# Patient Record
Sex: Female | Born: 1955
Health system: Western US, Academic
[De-identification: ages and names within clinical notes are randomized; demographics above are authoritative.]

## PROBLEM LIST (undated history)

## (undated) DIAGNOSIS — R55 Syncope and collapse: Secondary | ICD-10-CM

## (undated) DIAGNOSIS — I2510 Atherosclerotic heart disease of native coronary artery without angina pectoris: Secondary | ICD-10-CM

## (undated) DIAGNOSIS — R072 Precordial pain: Secondary | ICD-10-CM

## (undated) DIAGNOSIS — E785 Hyperlipidemia, unspecified: Secondary | ICD-10-CM

## (undated) DIAGNOSIS — I10 Essential (primary) hypertension: Secondary | ICD-10-CM

## (undated) HISTORY — DX: Essential (primary) hypertension: I10

## (undated) HISTORY — DX: Syncope and collapse: R55

## (undated) HISTORY — PX: OTHER SURGICAL HISTORY: SHX169

## (undated) HISTORY — DX: Hyperlipidemia, unspecified: E78.5

## (undated) HISTORY — PX: FOOT SURGERY: SHX648

## (undated) HISTORY — PX: MANDIBLE FRACTURE SURGERY: SHX706

## (undated) HISTORY — DX: Atherosclerotic heart disease of native coronary artery without angina pectoris: I25.10

## (undated) HISTORY — PX: CARDIAC CATHETERIZATION: SHX172

## (undated) HISTORY — PX: CHOLECYSTECTOMY: SHX55

## (undated) HISTORY — PX: APPENDECTOMY: SHX54

## (undated) HISTORY — PX: ABDOMINAL HYSTERECTOMY: SHX81

## (undated) HISTORY — DX: Precordial pain: R07.2

## (undated) HISTORY — PX: WRIST FRACTURE SURGERY: SHX121

---

## 1998-06-24 ENCOUNTER — Other Ambulatory Visit: Admission: RE | Admit: 1998-06-24 | Discharge: 1998-06-24 | Payer: Self-pay | Admitting: Obstetrics & Gynecology

## 1999-03-26 ENCOUNTER — Ambulatory Visit (HOSPITAL_COMMUNITY): Admission: RE | Admit: 1999-03-26 | Discharge: 1999-03-26 | Payer: Self-pay | Admitting: Unknown Physician Specialty

## 2000-01-21 ENCOUNTER — Ambulatory Visit (HOSPITAL_COMMUNITY): Admission: RE | Admit: 2000-01-21 | Discharge: 2000-01-21 | Payer: Self-pay | Admitting: Obstetrics & Gynecology

## 2000-01-21 ENCOUNTER — Encounter: Payer: Self-pay | Admitting: Obstetrics & Gynecology

## 2000-01-25 ENCOUNTER — Emergency Department (HOSPITAL_COMMUNITY): Admission: EM | Admit: 2000-01-25 | Discharge: 2000-01-25 | Payer: Self-pay | Admitting: Emergency Medicine

## 2008-01-31 DIAGNOSIS — E559 Vitamin D deficiency, unspecified: Secondary | ICD-10-CM | POA: Insufficient documentation

## 2016-07-26 DIAGNOSIS — R079 Chest pain, unspecified: Secondary | ICD-10-CM | POA: Diagnosis not present

## 2016-07-26 DIAGNOSIS — I2510 Atherosclerotic heart disease of native coronary artery without angina pectoris: Secondary | ICD-10-CM | POA: Diagnosis not present

## 2016-07-27 DIAGNOSIS — R079 Chest pain, unspecified: Secondary | ICD-10-CM | POA: Diagnosis not present

## 2016-07-27 DIAGNOSIS — I2510 Atherosclerotic heart disease of native coronary artery without angina pectoris: Secondary | ICD-10-CM | POA: Diagnosis not present

## 2016-07-28 DIAGNOSIS — E785 Hyperlipidemia, unspecified: Secondary | ICD-10-CM

## 2016-07-28 DIAGNOSIS — I2510 Atherosclerotic heart disease of native coronary artery without angina pectoris: Secondary | ICD-10-CM

## 2016-07-28 DIAGNOSIS — R072 Precordial pain: Secondary | ICD-10-CM | POA: Insufficient documentation

## 2016-07-28 HISTORY — DX: Atherosclerotic heart disease of native coronary artery without angina pectoris: I25.10

## 2016-07-28 HISTORY — DX: Precordial pain: R07.2

## 2016-07-28 HISTORY — DX: Hyperlipidemia, unspecified: E78.5

## 2017-01-31 ENCOUNTER — Telehealth: Payer: Self-pay | Admitting: Cardiology

## 2017-01-31 MED ORDER — NITROGLYCERIN 0.4 MG SL SUBL: 0.4000 mg | SUBLINGUAL_TABLET | SUBLINGUAL | 5 refills | Status: DC | PRN

## 2017-01-31 NOTE — Telephone Encounter (Signed)
Refills sent

## 2017-01-31 NOTE — Telephone Encounter (Signed)
Please call in Nitro to the Penny [facility] on [address], she has appt with you tomorrow and is out!!

## 2017-02-01 ENCOUNTER — Ambulatory Visit: Payer: Non-veteran care | Admitting: Cardiology

## 2017-03-03 ENCOUNTER — Telehealth: Payer: Self-pay | Admitting: Cardiology

## 2017-03-03 NOTE — Telephone Encounter (Signed)
Fax Ranexa to [place] [phone] attn [facility]

## 2017-03-04 NOTE — Telephone Encounter (Signed)
Pt will need to reschedule her appointment with Dr. Agustin Cree prior to receiving additional refills.

## 2017-04-04 ENCOUNTER — Encounter: Payer: Self-pay | Admitting: Cardiology

## 2017-04-04 ENCOUNTER — Ambulatory Visit (INDEPENDENT_AMBULATORY_CARE_PROVIDER_SITE_OTHER): Payer: No Typology Code available for payment source | Admitting: Cardiology

## 2017-04-04 ENCOUNTER — Other Ambulatory Visit: Payer: Self-pay

## 2017-04-04 VITALS — BP 120/80 | HR 80 | Resp 10 | Ht 61.0 in | Wt 183.8 lb

## 2017-04-04 DIAGNOSIS — R072 Precordial pain: Secondary | ICD-10-CM | POA: Diagnosis not present

## 2017-04-04 DIAGNOSIS — I2510 Atherosclerotic heart disease of native coronary artery without angina pectoris: Secondary | ICD-10-CM

## 2017-04-04 DIAGNOSIS — E785 Hyperlipidemia, unspecified: Secondary | ICD-10-CM

## 2017-04-04 MED ORDER — RANOLAZINE ER 1000 MG PO TB12: 1000.0000 mg | ORAL_TABLET | Freq: Two times a day (BID) | ORAL | 3 refills | Status: DC

## 2017-04-04 NOTE — Patient Instructions (Signed)
Medication Instructions:  Your physician recommends that you continue on your current medications as directed. Please refer to the Current Medication list given to you today.  Based on your lab work, Dr. Agustin Cree might restart you on a statin medication. I will let you know when he reviews your labs.   Labwork: Your physician recommends that you have lab work today to check your cholesterol.  Testing/Procedures: EKG today in office.   Follow-Up: Your physician wants you to follow-up in: 4-5 months. You will receive a reminder letter in the mail two months in advance. If you don't receive a letter, please call our office to schedule the follow-up appointment.  Any Other Special Instructions Will Be Listed Below (If Applicable).  Please note that any paperwork needing to be filled out by the provider will need to be addressed at the front desk prior to seeing the provider. Please note that any paperwork FMLA, Disability or other documents regarding health condition is subject to a $25.00 charge that must be received prior to completion of paperwork in the form of a money order or check.    If you need a refill on your cardiac medications before your next appointment, please call your pharmacy.

## 2017-04-04 NOTE — Progress Notes (Signed)
Cardiology Office Note:    Date:  04/04/2017   ID:  Penny Boyer, DOB [birth year], MRN [id number]  PCP:  [place] [facility]  Cardiologist:  Jenne Campus, MD    Referring MD: No ref. provider found   Chief Complaint  Patient presents with  . Follow-up  Doing well but still have some exertional chest pain  History of Present Illness:    Penny Boyer is a 61 y.o. female have history of coronary artery disease PTCA and stenting in circumflex artery done March 2017.  Since that time she experienced rare episode of exertional chest tightness for which she takes nitroglycerin.  She is feels dramatically better while taking ranolazine thousand grams twice daily and long-acting nitroglycerin.  Stable she does not have worsening of her symptoms overall perform activities of daily living with no difficulties every day in the morning she spent 1 hour walking she usually does about 4 miles.  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  She has some difficulty tolerating statin and apparently for some reason her pravastatin were taken away.  I was able to put her and said that she was able to tolerate it.  We started talking about this again in my opinion she need to be on high intensity statin and she is willing to try it again since she lost significant weight I will recheck her cholesterol today.  I will also ask her to have EKG done today.  Past Medical History:  Diagnosis Date  . Hyperlipidemia   . Hypertension     Past Surgical History:  Procedure Laterality Date  . ABDOMINAL HYSTERECTOMY    . APPENDECTOMY    . CARDIAC CATHETERIZATION    . CHOLECYSTECTOMY    . FOOT SURGERY    . MANDIBLE FRACTURE SURGERY    . Ulnar Surgery    . WRIST FRACTURE SURGERY      Current Medications: Current Meds  Medication Sig  . aspirin (GOODSENSE ASPIRIN) 325 MG tablet Take 325 mg by mouth daily.  . clopidogrel (PLAVIX) 75 MG tablet Take  75 mg by mouth daily.  . isosorbide mononitrate (IMDUR) 60 MG 24 hr tablet Take 30 mg by mouth daily.  [name] loratadine (CLARITIN) 10 MG tablet Take 10 mg by mouth daily.  [name] losartan (COZAAR) 100 MG tablet Take 100 mg by mouth daily.  . nitroGLYCERIN (NITROSTAT) 0.4 MG SL tablet Place 1 tablet (0.4 mg total) under the tongue as needed for chest pain.  . pravastatin (PRAVACHOL) 20 MG tablet Take 20 mg by mouth daily.  . propranolol ER (INDERAL LA) 120 MG 24 hr capsule Take 120 mg by mouth daily.  . ranitidine (ZANTAC) 150 MG tablet Take 150 mg by mouth 2 (two) times daily.  . ranolazine (RANEXA) 1000 MG SR tablet Take 1,000 mg by mouth 2 (two) times daily.     Allergies:   Patient has no known allergies.   Social History   Socioeconomic History  . Marital status: Married    Spouse name: None  . Number of children: None  . Years of education: None  . Highest education level: None  Social Needs  . Financial resource strain: None  . Food insecurity - worry: None  . Food insecurity - inability: None  . Transportation needs - medical: None  . Transportation needs - non-medical: None  Occupational History  . None  Tobacco Use  . Smoking status:  Former Smoker  . Smokeless tobacco: Never Used  Substance and Sexual Activity  . Alcohol use: Yes  . Drug use: No  . Sexual activity: None  Other Topics Concern  . None  Social History Narrative  . None     Family History: The patient's family history includes Cancer in her mother; Heart disease in her brother, brother, and father. ROS:   Please see the history of present illness.    All 14 point review of systems negative except as described per history of present illness  EKGs/Labs/Other Studies Reviewed:    EKG done today shows normal sinus rhythm, normal P interval, normal QRS complex duration morphology, nonspecific ST segment changes in form of asymmetrical T inversion V1 to V3   Recent Labs: No results found for requested labs  within last 8760 hours.  Recent Lipid Panel No results found for: CHOL, TRIG, HDL, CHOLHDL, VLDL, LDLCALC, LDLDIRECT  Physical Exam:    VS:  BP 120/80   Pulse 80   Resp 10   Ht 5\' 1"  (1.549 m)   Wt 183 lb 12.8 oz (83.4 kg)   BMI 34.73 kg/m     Wt Readings from Last 3 Encounters:  04/04/17 183 lb 12.8 oz (83.4 kg)     GEN:  Well nourished, well developed in no acute distress HEENT: Normal NECK: No JVD; No carotid bruits LYMPHATICS: No lymphadenopathy CARDIAC: RRR, no murmurs, no rubs, no gallops RESPIRATORY:  Clear to auscultation without rales, wheezing or rhonchi  ABDOMEN: Soft, non-tender, non-distended MUSCULOSKELETAL:  No edema; No deformity  SKIN: Warm and dry LOWER EXTREMITIES: no swelling NEUROLOGIC:  Alert and oriented x 3 PSYCHIATRIC:  Normal affect   ASSESSMENT:    1. Dyslipidemia (high LDL; low HDL)   2. Coronary artery disease involving native coronary artery of native heart without angina pectoris   3. Precordial pain    PLAN:    In order of problems listed above:  1. Coronary artery disease: Stable angina pectoris: Her last stress test from March of this year reviewed it was done in the hospital showed no evidence of ischemia.  I will continue present management which includes beta-blocker, ranolazine, long-acting nitrates and nitroglycerin as needed. 2. Dyslipidemia: We will recheck her fasting lipid profile today 3. Precordial chest pain: Rare episodes plan as outlined above.   Medication Adjustments/Labs and Tests Ordered: Current medicines are reviewed at length with the patient today.  Concerns regarding medicines are outlined above.  No orders of the defined types were placed in this encounter.  Medication changes: No orders of the defined types were placed in this encounter.   Signed, Park Liter, MD, [facility] 04/04/2017 11:12 AM    [place]

## 2017-04-04 NOTE — Progress Notes (Signed)
RX reprinted to fax to [place].

## 2017-04-05 LAB — LIPID PANEL
CHOL/HDL RATIO: 4.2 ratio (ref 0.0–4.4)
Cholesterol, Total: 250 mg/dL — ABNORMAL HIGH (ref 100–199)
HDL: 59 mg/dL (ref >39)
LDL CALC: 148 mg/dL — AB (ref 0–99)
TRIGLYCERIDES: 215 mg/dL — AB (ref 0–149)
VLDL Cholesterol Cal: 43 mg/dL — ABNORMAL HIGH (ref 5–40)

## 2018-06-19 ENCOUNTER — Telehealth: Payer: Self-pay | Admitting: Cardiology

## 2018-06-21 NOTE — Telephone Encounter (Signed)
done

## 2018-08-07 ENCOUNTER — Telehealth: Payer: Self-pay | Admitting: Cardiology

## 2018-08-07 MED ORDER — RANOLAZINE ER 1000 MG PO TB12: 1000.0000 mg | ORAL_TABLET | Freq: Two times a day (BID) | ORAL | 0 refills | Status: DC

## 2018-08-07 NOTE — Telephone Encounter (Signed)
Ranexa 1000 mg twice daily refilled.

## 2018-08-07 NOTE — Addendum Note (Signed)
Addended by: Ashok Norris on: 08/07/2018 04:21 PM   Modules accepted: Orders

## 2018-08-07 NOTE — Telephone Encounter (Signed)
°  Patient is out of medication!!!   1. Which medications need to be refilled? (please list name of each medication and dose if known) ranolazine 1000mg  twice daily  2. Which pharmacy/location (including street and city if local pharmacy) is medication to be sent to?[facility] in [place]  3. Do they need a 30 day or 90 day supply? [place]

## 2018-09-12 ENCOUNTER — Telehealth: Payer: Self-pay | Admitting: *Deleted

## 2018-09-12 MED ORDER — NITROGLYCERIN 0.4 MG SL SUBL: 0.4000 mg | SUBLINGUAL_TABLET | SUBLINGUAL | 5 refills | Status: DC | PRN

## 2018-09-12 NOTE — Telephone Encounter (Signed)
Rx refill sent to pharmacy.   *STAT* If patient is at the pharmacy, call can be transferred to refill team.   1. Which medications need to be refilled? (please list name of each medication and dose if known) Nitroglycerine  2. Which pharmacy/location (including street and city if local pharmacy) is medication to be sent to?[place]  3. Do they need a 30 day or 90 day supply?

## 2018-09-15 ENCOUNTER — Telehealth: Payer: Self-pay | Admitting: *Deleted

## 2018-09-15 NOTE — Telephone Encounter (Signed)
Pt set up for virtual visit on 5/18 but having some chest pain and shortness of breath. Has been taking nitro for it. I let her know someone may be calling her about this.     Cardiac Questionnaire:    Since your last visit or hospitalization:    1. Have you been having new or worsening chest pain? yes   2. Have you been having new or worsening shortness of breath? yes 3. Have you been having new or worsening leg swelling, wt gain, or increase in abdominal girth (pants fitting more tightly)? no   4. Have you had any passing out spells? no    *A YES to any of these questions would result in the appointment being kept. *If all the answers to these questions are NO, we should indicate that given the current situation regarding the worldwide coronarvirus pandemic, at the recommendation of the CDC, we are looking to limit gatherings in our waiting area, and thus will reschedule their appointment beyond four weeks from today.   _____________   [id number] Pre-Screening Questions:  . Do you currently have a fever? no (yes = cancel and refer to pcp for e-visit) . Have you recently travelled on a cruise, internationally, or to [place], [place], [place], [place], [place], or [place], [place] [employer]) ? no (yes = cancel, stay home, monitor symptoms, and contact pcp or initiate e-visit if symptoms develop) . Have you been in contact with someone that is currently pending confirmation of Covid19 testing or has been confirmed to have the [place] virus?  no (yes = cancel, stay home, away from tested individual, monitor symptoms, and contact pcp or initiate e-visit if symptoms develop) . Are you currently experiencing fatigue or cough? no (yes = pt should be prepared to have a mask placed at the time of their visit).         YOUR CARDIOLOGY TEAM HAS ARRANGED FOR AN E-VISIT FOR YOUR APPOINTMENT - PLEASE REVIEW IMPORTANT INFORMATION BELOW SEVERAL DAYS PRIOR TO YOUR APPOINTMENT  Due to the recent COVID-19 pandemic, we are  transitioning in-person office visits to tele-medicine visits in an effort to decrease unnecessary exposure to our patients, their families, and staff. These visits are billed to your insurance just like a normal visit is. We also encourage you to sign up for MyChart if you have not already done so. You will need a smartphone if possible. For patients that do not have this, we can still complete the visit using a regular telephone but do prefer a smartphone to enable video when possible. You may have a family member that lives with you that can help. If possible, we also ask that you have a blood pressure cuff and scale at home to measure your blood pressure, heart rate and weight prior to your scheduled appointment. Patients with clinical needs that need an in-person evaluation and testing will still be able to come to the office if absolutely necessary. If you have any questions, feel free to call our office.     YOUR PROVIDER WILL BE USING THE FOLLOWING PLATFORM TO COMPLETE YOUR VISIT: Staff: Please delete this text and fill in MyChart/[email or web address]  . IF USING MYCHART - How to Download the MyChart App to Your SmartPhone   - If Apple, go to [employer] and type in MyChart in the search bar and download the app. If Android, ask patient to go to [employer] and type in [place] in the search bar and download the app. The  app is free but as with any other app downloads, your phone may require you to verify saved payment information or Apple/Android password.  - You will need to then log into the app with your MyChart username and password, and select [facility] as your healthcare provider to link the account.  - When it is time for your visit, go to the MyChart app, find appointments, and click Begin Video Visit. Be sure to Select Allow for your device to access the Microphone and Camera for your visit. You will then be connected, and your provider will be with you shortly.  **If you have any  issues connecting or need assistance, please contact MyChart service desk [phone])**  **If using a computer, in order to ensure the best quality for your visit, you will need to use either of the following Internet Browsers: Insurance underwriter or [employer]**  . IF USING DOXIMITY or [email or web address] - The staff will give you instructions on receiving your link to join the meeting the day of your visit.      2-3 DAYS BEFORE YOUR APPOINTMENT  You will receive a telephone call from one of our [facility] team members - your caller ID may say "Unknown caller." If this is a video visit, we will walk you through how to get the video launched on your phone. We will remind you check your blood pressure, heart rate and weight prior to your scheduled appointment. If you have an Apple Watch or Kardia, please upload any pertinent ECG strips the day before or morning of your appointment to [facility]. Our staff will also make sure you have reviewed the consent and agree to move forward with your scheduled tele-health visit.     THE DAY OF YOUR APPOINTMENT  Approximately 15 minutes prior to your scheduled appointment, you will receive a telephone call from one of [facility] team - your caller ID may say "Unknown caller."  Our staff will confirm medications, vital signs for the day and any symptoms you may be experiencing. Please have this information available prior to the time of visit start. It may also be helpful for you to have a pad of paper and pen handy for any instructions given during your visit. They will also walk you through joining the smartphone meeting if this is a video visit.    CONSENT FOR TELE-HEALTH VISIT - PLEASE REVIEW  I hereby voluntarily request, consent and authorize [facility] and its employed or contracted physicians, physician assistants, nurse practitioners or other licensed health care professionals (the Practitioner), to provide me with telemedicine health care  services (the "Services") as deemed necessary by the treating Practitioner. I acknowledge and consent to receive the Services by the Practitioner via telemedicine. I understand that the telemedicine visit will involve communicating with the Practitioner through live audiovisual communication technology and the disclosure of certain medical information by electronic transmission. I acknowledge that I have been given the opportunity to request an in-person assessment or other available alternative prior to the telemedicine visit and am voluntarily participating in the telemedicine visit.  I understand that I have the right to withhold or withdraw my consent to the use of telemedicine in the course of my care at any time, without affecting my right to future care or treatment, and that the Practitioner or I may terminate the telemedicine visit at any time. I understand that I have the right to inspect all information obtained and/or recorded in the course of the telemedicine visit and may  receive copies of available information for a reasonable fee.  I understand that some of the potential risks of receiving the Services via telemedicine include:  [name] Delay or interruption in medical evaluation due to technological equipment failure or disruption; . Information transmitted may not be sufficient (e.g. poor resolution of images) to allow for appropriate medical decision making by the Practitioner; and/or  . In rare instances, security protocols could fail, causing a breach of personal health information.  Furthermore, I acknowledge that it is my responsibility to provide information about my medical history, conditions and care that is complete and accurate to the best of my ability. I acknowledge that Practitioner's advice, recommendations, and/or decision may be based on factors not within their control, such as incomplete or inaccurate data provided by me or distortions of diagnostic images or specimens that may  result from electronic transmissions. I understand that the practice of medicine is not an exact science and that Practitioner makes no warranties or guarantees regarding treatment outcomes. I acknowledge that I will receive a copy of this consent concurrently upon execution via email to the email address I last provided but may also request a printed copy by calling the office of [facility].    I understand that my insurance will be billed for this visit.   I have read or had this consent read to me. . I understand the contents of this consent, which adequately explains the benefits and risks of the Services being provided via telemedicine.  . I have been provided ample opportunity to ask questions regarding this consent and the Services and have had my questions answered to my satisfaction. . I give my informed consent for the services to be provided through the use of telemedicine in my medical care  By participating in this telemedicine visit I agree to the above. Pt gives consent

## 2018-09-15 NOTE — Telephone Encounter (Signed)
Patient complains of having chest pain for over 1 week and shortness of breath patient advised to go to the emergency department she verbally understands.

## 2018-09-28 ENCOUNTER — Encounter: Payer: Self-pay | Admitting: *Deleted

## 2018-09-28 DIAGNOSIS — E785 Hyperlipidemia, unspecified: Secondary | ICD-10-CM | POA: Insufficient documentation

## 2018-09-28 DIAGNOSIS — I10 Essential (primary) hypertension: Secondary | ICD-10-CM | POA: Insufficient documentation

## 2018-10-02 ENCOUNTER — Other Ambulatory Visit: Payer: Self-pay

## 2018-10-02 ENCOUNTER — Encounter: Payer: Self-pay | Admitting: Cardiology

## 2018-10-02 ENCOUNTER — Telehealth (INDEPENDENT_AMBULATORY_CARE_PROVIDER_SITE_OTHER): Payer: No Typology Code available for payment source | Admitting: Cardiology

## 2018-10-02 ENCOUNTER — Encounter: Payer: Self-pay | Admitting: Emergency Medicine

## 2018-10-02 VITALS — BP 163/108 | HR 78 | Wt 182.0 lb

## 2018-10-02 DIAGNOSIS — E785 Hyperlipidemia, unspecified: Secondary | ICD-10-CM

## 2018-10-02 DIAGNOSIS — I2510 Atherosclerotic heart disease of native coronary artery without angina pectoris: Secondary | ICD-10-CM

## 2018-10-02 DIAGNOSIS — I10 Essential (primary) hypertension: Secondary | ICD-10-CM

## 2018-10-02 DIAGNOSIS — E782 Mixed hyperlipidemia: Secondary | ICD-10-CM

## 2018-10-02 NOTE — Progress Notes (Signed)
Virtual Visit via Telephone Note   This visit type was conducted due to national recommendations for restrictions regarding the COVID-19 Pandemic (e.g. social distancing) in an effort to limit this patient's exposure and mitigate transmission in our community.  Due to her co-morbid illnesses, this patient is at least at moderate risk for complications without adequate follow up.  This format is felt to be most appropriate for this patient at this time.  The patient did not have access to video technology/had technical difficulties with video requiring transitioning to audio format only (telephone).  All issues noted in this document were discussed and addressed.  No physical exam could be performed with this format.  Please refer to the patient's chart for her  consent to telehealth for [facility].  Evaluation Performed:  Follow-up visit  This visit type was conducted due to national recommendations for restrictions regarding the COVID-19 Pandemic (e.g. social distancing).  This format is felt to be most appropriate for this patient at this time.  All issues noted in this document were discussed and addressed.  No physical exam was performed (except for noted visual exam findings with Video Visits).  Please refer to the patient's chart (MyChart message for video visits and phone note for telephone visits) for the patient's consent to telehealth for [facility].  Date:  10/02/2018  ID: Penny Boyer, DOB [birth year], MRN [id number]   Patient Location: [place] [address]   Provider location:   [place] Office  PCP:  [place]  Cardiologist:  Jenne Campus, MD     Chief Complaint: Doing well but 2 weeks ago had some chest pain  History of Present Illness:    Penny Boyer is a 63 y.o. female  who presents via audio/video conferencing for a telehealth visit today.  With coronary artery disease, status post PTCA and stenting  involving the circumflex artery done in March 2017.  Since that time she has been doing very well.  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  Those pain could last for few minutes she actually was thinking about retrying nitroglycerin but did not do it.  Since that time she is completely asymptomatic again she is busy taking care of her animals have no difficulty.   The patient does not have symptoms concerning for COVID-19 infection (fever, chills, cough, or new SHORTNESS OF BREATH).    Prior CV studies:   The following studies were reviewed today:       Past Medical History:  Diagnosis Date  . Coronary artery disease involving native coronary artery has post PTCA and stenting of the circumflex artery in March 2017 of native heart 07/28/2016   Overview:  PTCA and stent of LCX artery in March 2017 in [place]  . Dyslipidemia (high LDL; low HDL) 07/28/2016  . Hyperlipidemia   . Hypertension   . Precordial pain 07/28/2016       Current Meds  Medication Sig  . aspirin (GOODSENSE ASPIRIN) 325 MG tablet Take 325 mg by mouth daily.  . isosorbide mononitrate (IMDUR) 30 MG 24 hr tablet Take 30 mg by mouth daily.   [name] losartan (COZAAR) 100 MG tablet Take 100 mg by mouth daily.  . nitroGLYCERIN (NITROSTAT) 0.4 MG SL tablet Place 1 tablet (0.4  mg total) under the tongue as needed for chest pain.  [name] propranolol ER (INDERAL LA) 120 MG 24 hr capsule Take 120 mg by mouth daily.  . ranolazine (RANEXA) 1000 MG SR tablet Take 1 tablet (1,000 mg total) by mouth 2 (two) times daily.      Family History: The patient's family history includes Cancer in her mother; Heart disease in her brother, brother, and father.   ROS:   Please see the history of present illness.     All other systems reviewed and are negative.    Labs/Other Tests and Data Reviewed:     Recent Labs: No results found for requested labs within last 8760 hours.  Recent Lipid Panel    Component Value Date/Time   CHOL 250 (H) 04/04/2017 1138   TRIG 215 (H) 04/04/2017 1138   HDL 59 04/04/2017 1138   CHOLHDL 4.2 04/04/2017 1138   LDLCALC 148 (H) 04/04/2017 1138      Exam:    Vital Signs:  BP (!) 163/108   Pulse 78   Wt 182 lb (82.6 kg)   BMI 34.39 kg/m     Wt Readings from Last 3 Encounters:  10/02/18 182 lb (82.6 kg)  04/04/17 183 lb 12.8 oz (83.4 kg)     Well nourished, well developed in no acute distress. Alert awake in exam 3 quite cheerful happy to be able to talk to me over the phone.  Diagnosis for this visit:   1. Coronary artery disease involving native coronary artery of native heart without angina pectoris   2. Dyslipidemia (high LDL; low HDL)   3. Mixed hyperlipidemia   4. Essential hypertension      ASSESSMENT & PLAN:    1.  Coronary artery disease.  She described to have some chest pain about 2 weeks ago.  I will schedule her to stress test to make sure she does not have any significant inducible ischemia in the meantime we will continue antianginal therapy which include ranolazine as well as Imdur. 2.  Dyslipidemia ongoing issues she is supposed to be referred to lipid clinic for some reason it did not happen.  She got difficulty tolerating statin she was on pravastatin and had difficulty tolerating this Dr. CVA switch her to something different that she does not remember the name but also she could not tolerate it.  I think we reached the point that more aggressive management needs to be implemented.  Therefore, she will be referred to lipid clinic for consideration of for PCSK9 agent.  I will also ask you to have fasting lipid profile when she comes here next time which will be for stress test.  I anticipate stress test to be done in few weeks. 3.  Essential hypertension blood pressure appears to be  controlled when she is at home.  We will continue monitoring.  COVID-19 Education: The signs and symptoms of COVID-19 were discussed with the patient and how to seek care for testing (follow up with PCP or arrange E-visit).  The importance of social distancing was discussed today.  Patient Risk:   After full review of this patients clinical status, I feel that they are at least moderate risk at this time.  Time:   Today, I have spent 21 minutes with the patient with telehealth technology discussing pt health issues.  I spent 5 minutes reviewing her chart before the visit.  Visit was finished at 8:55 AM.    Medication Adjustments/Labs and Tests Ordered: Current medicines are  reviewed at length with the patient today.  Concerns regarding medicines are outlined above.  Orders Placed This Encounter  Procedures  . Lipid panel  . AMB Referral to Advanced Lipid Disorders Clinic  . MYOCARDIAL PERFUSION IMAGING   Medication changes: No orders of the defined types were placed in this encounter.    Disposition: Follow-up in 1 month  Signed, Park Liter, MD, [facility] 10/02/2018 9:16 AM    [place]

## 2018-10-02 NOTE — Patient Instructions (Signed)
Medication Instructions:  Your physician recommends that you continue on your current medications as directed. Please refer to the Current Medication list given to you today.  If you need a refill on your cardiac medications before your next appointment, please call your pharmacy.   Lab work: Your physician recommends that you return for lab work in 1 week: Fasting lipids  If you have labs (blood work) drawn today and your tests are completely normal, you will receive your results only by: [name] MyChart Message (if you have MyChart) OR . A paper copy in the mail If you have any lab test that is abnormal or we need to change your treatment, we will call you to review the results.  Testing/Procedures: Your physician has requested that you have a lexiscan myoview. For further information please visit [email or web address]. Please follow instruction sheet, as given.    Follow-Up: At [facility], you and your health needs are our priority.  As part of our continuing mission to provide you with exceptional heart care, we have created designated Provider Care Teams.  These Care Teams include your primary Cardiologist (physician) and Advanced Practice Providers (APPs -  Physician Assistants and Nurse Practitioners) who all work together to provide you with the care you need, when you need it. You will need a follow up appointment in 1 months.  Please call our office 2 months in advance to schedule this appointment.  You may see No primary care provider on file. or another member of our [employer] Provider Team in [place]: Shirlee More, MD . Jyl Heinz, MD  Any Other Special Instructions Will Be Listed Below (If Applicable).  Dr. Agustin Cree has referred you to the lipid clinic, their office should call you within 1 week. If not please call our office.   Cardiac Nuclear Scan A cardiac nuclear scan is a test that measures blood flow to the heart when a person is resting and when he or she is  exercising. The test looks for problems such as:  Not enough blood reaching a portion of the heart.  The heart muscle not working normally. You may need this test if:  You have heart disease.  You have had abnormal lab results.  You have had heart surgery or a balloon procedure to open up blocked arteries (angioplasty).  You have chest pain.  You have shortness of breath. In this test, a radioactive dye (tracer) is injected into your bloodstream. After the tracer has traveled to your heart, an imaging device is used to measure how much of the tracer is absorbed by or distributed to various areas of your heart. This procedure is usually done at a hospital and takes 2-4 hours. Tell a health care provider about:  Any allergies you have.  All medicines you are taking, including vitamins, herbs, eye drops, creams, and over-the-counter medicines.  Any problems you or family members have had with anesthetic medicines.  Any blood disorders you have.  Any surgeries you have had.  Any medical conditions you have.  Whether you are pregnant or may be pregnant. What are the risks? Generally, this is a safe procedure. However, problems may occur, including:  Serious chest pain and heart attack. This is only a risk if the stress portion of the test is done.  Rapid heartbeat.  Sensation of warmth in your chest. This usually passes quickly.  Allergic reaction to the tracer. What happens before the procedure?  Ask your health care provider about changing or stopping your regular  medicines. This is especially important if you are taking diabetes medicines or blood thinners.  Follow instructions from your health care provider about eating or drinking restrictions.  Remove your jewelry on the day of the procedure. What happens during the procedure?  An IV will be inserted into one of your veins.  Your health care provider will inject a small amount of radioactive tracer through the  IV.  You will wait for 20-40 minutes while the tracer travels through your bloodstream.  Your heart activity will be monitored with an electrocardiogram (ECG).  You will lie down on an exam table.  Images of your heart will be taken for about 15-20 minutes.  You may also have a stress test. For this test, one of the following may be done: ? You will exercise on a treadmill or stationary bike. While you exercise, your heart's activity will be monitored with an ECG, and your blood pressure will be checked. ? You will be given medicines that will increase blood flow to parts of your heart. This is done if you are unable to exercise.  When blood flow to your heart has peaked, a tracer will again be injected through the IV.  After 20-40 minutes, you will get back on the exam table and have more images taken of your heart.  Depending on the type of tracer used, scans may need to be repeated 3-4 hours later.  Your IV line will be removed when the procedure is over. The procedure may vary among health care providers and hospitals. What happens after the procedure?  Unless your health care provider tells you otherwise, you may return to your normal schedule, including diet, activities, and medicines.  Unless your health care provider tells you otherwise, you may increase your fluid intake. This will help to flush the contrast dye from your body. Drink enough fluid to keep your urine pale yellow.  Ask your health care provider, or the department that is doing the test: ? When will my results be ready? ? How will I get my results? Summary  A cardiac nuclear scan measures the blood flow to the heart when a person is resting and when he or she is exercising.  Tell your health care provider if you are pregnant.  Before the procedure, ask your health care provider about changing or stopping your regular medicines. This is especially important if you are taking diabetes medicines or blood  thinners.  After the procedure, unless your health care provider tells you otherwise, increase your fluid intake. This will help flush the contrast dye from your body.  After the procedure, unless your health care provider tells you otherwise, you may return to your normal schedule, including diet, activities, and medicines. This information is not intended to replace advice given to you by your health care provider. Make sure you discuss any questions you have with your health care provider. Document Released: 05/28/2004 Document Revised: 10/17/2017 Document Reviewed: 10/17/2017 Elsevier Interactive Patient Education  2019 [employer].

## 2018-10-04 ENCOUNTER — Telehealth: Payer: Self-pay | Admitting: Internal Medicine

## 2018-10-04 NOTE — Telephone Encounter (Signed)
Lmtcb.  New patient.  Needs appointment in [facility].  Can be a virtual visit. Please schedule. Thanks

## 2018-11-10 ENCOUNTER — Telehealth: Payer: Self-pay | Admitting: Internal Medicine

## 2018-11-10 NOTE — Telephone Encounter (Signed)
called to pre reg VM box is full, no my chart & no email

## 2018-11-14 ENCOUNTER — Telehealth: Payer: No Typology Code available for payment source | Admitting: Cardiology

## 2018-11-14 NOTE — Telephone Encounter (Signed)
° ° °  Virtual Visit Pre-Appointment Phone Call 1. 3. "In the setting of the current Covid19 crisis, you are scheduled for a (phone or video) visit with your provider on (date) at (time).  Just as we do with many in-office visits, in order for you to participate in this visit, we must obtain consent.  If you'd like, I can send this to your mychart (if signed up) or email for you to review.  Otherwise, I can obtain your verbal consent now.  All virtual visits are billed to your insurance company just like a normal visit would be.  By agreeing to a virtual visit, we'd like you to understand that the technology does not allow for your provider to perform an examination, and thus may limit your provider's ability to fully assess your condition. If your provider identifies any concerns that need to be evaluated in person, we will make arrangements to do so.  Finally, though the technology is pretty good, we cannot assure that it will always work on either your or our end, and in the setting of a video visit, we may have to convert it to a phone-only visit.  In either situation, we cannot ensure that we have a secure connection.  Are you willing to proceed?" STAFF: Did the patient verbally acknowledge consent to telehealth visit? Document YES/NO here: Yes, pt gave her cconsent  2.  I called pt to confirm her visit with Dr Debara Pickett on 11-15-18.

## 2018-11-15 ENCOUNTER — Telehealth (INDEPENDENT_AMBULATORY_CARE_PROVIDER_SITE_OTHER): Payer: No Typology Code available for payment source | Admitting: Internal Medicine

## 2018-11-15 ENCOUNTER — Encounter: Payer: Self-pay | Admitting: Internal Medicine

## 2018-11-15 ENCOUNTER — Telehealth: Payer: Self-pay | Admitting: Internal Medicine

## 2018-11-15 VITALS — BP 149/91 | HR 78 | Ht 61.0 in | Wt 175.0 lb

## 2018-11-15 DIAGNOSIS — I2510 Atherosclerotic heart disease of native coronary artery without angina pectoris: Secondary | ICD-10-CM

## 2018-11-15 DIAGNOSIS — I10 Essential (primary) hypertension: Secondary | ICD-10-CM | POA: Diagnosis not present

## 2018-11-15 DIAGNOSIS — E785 Hyperlipidemia, unspecified: Secondary | ICD-10-CM | POA: Diagnosis not present

## 2018-11-15 DIAGNOSIS — Z789 Other specified health status: Secondary | ICD-10-CM

## 2018-11-15 NOTE — Patient Instructions (Signed)
Medication Instructions:  NO CHANGES  Dr. Debara Pickett will advise on medication changes after lab test If you need a refill on your cardiac medications before your next appointment, please call your pharmacy.   Lab work: FASTING lab work to check cholesterol  If you have labs (blood work) drawn today and your tests are completely normal, you will receive your results only by: [name] MyChart Message (if you have MyChart) OR . A paper copy in the mail If you have any lab test that is abnormal or we need to change your treatment, we will call you to review the results.  Testing/Procedures: NONE  Follow-Up: Dr. Debara Pickett recommends that you schedule a follow up visit with him the in the [place] in 3-4 months.

## 2018-11-15 NOTE — Progress Notes (Signed)
Virtual Visit via Telephone Note   This visit type was conducted due to national recommendations for restrictions regarding the COVID-19 Pandemic (e.g. social distancing) in an effort to limit this patient's exposure and mitigate transmission in our community.  Due to her co-morbid illnesses, this patient is at least at moderate risk for complications without adequate follow up.  This format is felt to be most appropriate for this patient at this time.  The patient did not have access to video technology/had technical difficulties with video requiring transitioning to audio format only (telephone).  All issues noted in this document were discussed and addressed.  No physical exam could be performed with this format.  Please refer to the patient's chart for her  consent to telehealth for [facility].   Evaluation Performed:  Telephone visit  Date:  11/15/2018   ID:  Penny Boyer, DOB March 31, 1956, MRN [id number]  Patient Location:  [place] [address]  Provider location:   [address], [address] [place], [place] [address]  PCP:  [place]  Cardiologist:  No primary care provider on file. Electrophysiologist:  None   Chief Complaint:  Dyslipidemia, statin intolerance  History of Present Illness:    Penny Boyer is a 63 y.o. female who presents via audio/video conferencing for a telehealth visit today.  This is a pleasant 63 year old female who is a former veteran and gets some care through the [place] as well as is a patient of Dr. Agustin Cree in [place].  She has a history of coronary artery disease and had PCI to the circumflex artery in 2017.  Recently she saw Dr. Agustin Cree and had been having some chest pain.  He had ordered a stress test however due to the coronavirus pandemic this was postponed.  She says those symptoms have resolved however recently had an incident with a horse and had some broken ribs.  She is recovering from that.  She is  rescheduled her stress test for December 06, 2018.  At that time she plans to have a repeat lipid profile.  Statin intolerance, both pravastatin and rosuvastatin were not tolerated due to myalgias.  A year ago showed total cholesterol 250, triglycerides 215, HDL 59 and LDL of 148.  Her goal LDL is less than 70.  She reports a fairly healthy diet low in saturated fat and cholesterol and she is physically active.  The patient does not have symptoms concerning for COVID-19 infection (fever, chills, cough, or new SHORTNESS OF BREATH).    Prior CV studies:   The following studies were reviewed today:  Chart review Lab work  PMHx:  Past Medical History:  Diagnosis Date  . Coronary artery disease involving native coronary artery has post PTCA and stenting of the circumflex artery in March 2017 of native heart 07/28/2016   Overview:  PTCA and stent of LCX artery in March 2017 in [place]  . Dyslipidemia (high LDL; low HDL) 07/28/2016  . Hyperlipidemia   . Hypertension   . Precordial pain 07/28/2016    FAMHx:  Family History  Problem Relation Age of Onset  . Cancer Mother   . Heart disease Father   . Heart disease Brother   . Heart disease Brother     SOCHx:   reports that she has quit smoking. She has never used smokeless tobacco. She reports current alcohol use. She reports that she does not use drugs.  ALLERGIES:  Allergies  Allergen Reactions  . Atorvastatin  Other (See Comments)    JOINT PAIN  . Hydrochlorothiazide W-Triamterene Other (See Comments)    UNKNOWN    MEDS:  Current Meds  Medication Sig  . aspirin (GOODSENSE ASPIRIN) 325 MG tablet Take 325 mg by mouth daily.  . isosorbide mononitrate (IMDUR) 30 MG 24 hr tablet Take 30 mg by mouth daily.   [name] losartan (COZAAR) 100 MG tablet Take 100 mg by mouth daily.  . nitroGLYCERIN (NITROSTAT) 0.4 MG SL tablet Place 1 tablet (0.4 mg total) under the tongue as needed for chest pain.  [name] propranolol ER (INDERAL LA) 120 MG 24 hr  capsule Take 120 mg by mouth daily.  . ranolazine (RANEXA) 1000 MG SR tablet Take 1 tablet (1,000 mg total) by mouth 2 (two) times daily.     ROS: month(s)  Labs/Other Tests and Data Reviewed:    Recent Labs: No results found for requested labs within last 8760 hours.   Recent Lipid Panel Lab Results  Component Value Date/Time   CHOL 250 (H) 04/04/2017 11:38 AM   TRIG 215 (H) 04/04/2017 11:38 AM   HDL 59 04/04/2017 11:38 AM   CHOLHDL 4.2 04/04/2017 11:38 AM   LDLCALC 148 (H) 04/04/2017 11:38 AM    Wt Readings from Last 3 Encounters:  11/15/18 175 lb (79.4 kg)  10/02/18 182 lb (82.6 kg)  04/04/17 183 lb 12.8 oz (83.4 kg)     Exam:    Vital Signs:  BP (!) 149/91   Pulse 78   Ht 5\' 1"  (1.549 m)   Wt 175 lb (79.4 kg)   BMI 33.07 kg/m    Exam not performed due to telephone visit  ASSESSMENT & PLAN:    1. Mixed dyslipidemia 2. Coronary artery disease status post PCI to the circumflex artery 2017 3. Statin intolerance  Ms. Toure has been intolerant to statins in the past with persistently elevated cholesterol including LDL and high triglycerides.  She is at increased risk for residual cardiovascular events.  She recently had some anginal pain and is awaiting stress testing.  She will have repeat fasting lipid testing at that time.  Based on those findings, we can likely proceed forward with PCSK9 inhibitor therapy.  I think she is a good candidate for Repatha 140 mg every 2 weeks.  We discussed that therapy today she seems willing to try it.  We would then repeat labs in about 3 to 4 months after starting it.  Thanks as always for the kind referral.  COVID-19 Education: The signs and symptoms of COVID-19 were discussed with the patient and how to seek care for testing (follow up with PCP or arrange E-visit).  The importance of social distancing was discussed today.  Patient Risk:   After full review of this patients clinical status, I feel that they are at least  moderate risk at this time.  Time:   Today, I have spent 25 minutes with the patient with telehealth technology discussing chest pain, dyslipidemia, coronary artery disease, statin intolerance.     Medication Adjustments/Labs and Tests Ordered: Current medicines are reviewed at length with the patient today.  Concerns regarding medicines are outlined above.   Tests Ordered: Orders Placed This Encounter  Procedures  . Lipid panel    Medication Changes: No orders of the defined types were placed in this encounter.   Disposition:  in 3 month(s)  Pixie Casino, MD, [facility], [place] Director of the Advanced Lipid Disorders &  Cardiovascular Risk Reduction Clinic Diplomate of the American Board of Clinical Lipidology Attending Cardiologist  Direct Dial: [phone]  Fax: [phone]  Website:  [email or web address]  Pixie Casino, MD  11/15/2018 11:04 AM

## 2018-11-15 NOTE — Telephone Encounter (Signed)
Patient called to review e-visit instructions. Patient aware that the following changes have been made: none Patient aware that they will need the following labs: fasting labs - patient to complete 7/22 same day as stress test Patient aware that they will need the following test(s): none  Recall for 3-4 month (lipid) entered.   No further assistance needed at this time.

## 2018-11-30 ENCOUNTER — Telehealth (HOSPITAL_COMMUNITY): Payer: Self-pay | Admitting: *Deleted

## 2018-11-30 NOTE — Telephone Encounter (Signed)
Patient given detailed instructions per Myocardial Perfusion Study Information Sheet for the test on 12/06/18 Patient notified to arrive 15 minutes early and that it is imperative to arrive on time for appointment to keep from having the test rescheduled.  If you need to cancel or reschedule your appointment, please call the office within 24 hours of your appointment. . Patient verbalized understanding. Kirstie Peri

## 2018-11-30 NOTE — Telephone Encounter (Signed)
Patient given detailed instructions per Myocardial Perfusion Study Information Sheet for the test on 12/06/18. Patient notified to arrive 15 minutes early and that it is imperative to arrive on time for appointment to keep from having the test rescheduled.  If you need to cancel or reschedule your appointment, please call the office within 24 hours of your appointment. . Patient verbalized understanding Kirstie Peri

## 2018-12-01 ENCOUNTER — Telehealth: Payer: Self-pay | Admitting: Cardiology

## 2018-12-01 MED ORDER — RANOLAZINE ER 1000 MG PO TB12: 1000.0000 mg | ORAL_TABLET | Freq: Two times a day (BID) | ORAL | 0 refills | Status: DC

## 2018-12-01 NOTE — Telephone Encounter (Signed)
Ranolazine refill faxed to [facility] at (fax) [phone]. Transmission confirmation received.

## 2018-12-01 NOTE — Telephone Encounter (Signed)
Call [place] to [facility]

## 2018-12-01 NOTE — Telephone Encounter (Signed)
Phoned pt, informed refill has been faxed to [facility]

## 2018-12-06 ENCOUNTER — Other Ambulatory Visit: Payer: Self-pay

## 2018-12-06 ENCOUNTER — Ambulatory Visit (INDEPENDENT_AMBULATORY_CARE_PROVIDER_SITE_OTHER): Payer: No Typology Code available for payment source

## 2018-12-06 DIAGNOSIS — I2510 Atherosclerotic heart disease of native coronary artery without angina pectoris: Secondary | ICD-10-CM

## 2018-12-06 LAB — MYOCARDIAL PERFUSION IMAGING
LV dias vol: 68 mL (ref 46–106)
LV sys vol: 23 mL
Peak HR: 103 {beats}/min
Rest HR: 72 {beats}/min
SDS: 0
SRS: 1
SSS: 1
TID: 1.11

## 2018-12-06 LAB — LIPID PANEL
Chol/HDL Ratio: 4.7 ratio — ABNORMAL HIGH (ref 0.0–4.4)
Cholesterol, Total: 211 mg/dL — ABNORMAL HIGH (ref 100–199)
HDL: 45 mg/dL (ref >39)
LDL Calculated: 139 mg/dL — ABNORMAL HIGH (ref 0–99)
Triglycerides: 137 mg/dL (ref 0–149)
VLDL Cholesterol Cal: 27 mg/dL (ref 5–40)

## 2018-12-06 MED ORDER — TECHNETIUM TC 99M TETROFOSMIN IV KIT
11.0000 | PACK | Freq: Once | INTRAVENOUS | Status: AC | PRN
  Administered 2018-12-06: 11 via INTRAVENOUS

## 2018-12-06 MED ORDER — TECHNETIUM TC 99M TETROFOSMIN IV KIT
32.7000 | PACK | Freq: Once | INTRAVENOUS | Status: AC | PRN
  Administered 2018-12-06: 32.7 via INTRAVENOUS

## 2018-12-06 MED ORDER — REGADENOSON 0.4 MG/5ML IV SOLN
0.4000 mg | Freq: Once | INTRAVENOUS | Status: AC
  Administered 2018-12-06: 0.4 mg via INTRAVENOUS

## 2018-12-11 ENCOUNTER — Telehealth: Payer: Self-pay | Admitting: Emergency Medicine

## 2018-12-11 ENCOUNTER — Telehealth: Payer: Self-pay | Admitting: Internal Medicine

## 2018-12-11 NOTE — Telephone Encounter (Signed)
Patient informed of lab results and stress test results. Confirmed follow up appointment and gave her number to lipid clinic as she has already been referred. Patient verbally understands. No further questions.

## 2018-12-11 NOTE — Telephone Encounter (Signed)
Lipids are not significantly changed - we will pursue Repatha 140 q2 weeks as planned. I will message Eliezer Lofts to start working on this when she returns.  Thanks.  Dr Lemmie Evens

## 2018-12-11 NOTE — Telephone Encounter (Signed)
Called patient no answer.Unable to leave a message mail box full.

## 2018-12-11 NOTE — Telephone Encounter (Signed)
New message   Patient states that the lab work is in the system and the lipid program can now be setup. Please call.

## 2018-12-11 NOTE — Telephone Encounter (Signed)
Attempted to contact patient for lab results, no answer and voicemail box is full. Will continue efforts.

## 2018-12-11 NOTE — Telephone Encounter (Signed)
Spoke to patient she stated she had a lipid panel done last week.Stated Dr.Hilty was waiting on results before appointment was scheduled with him.Stated he can see lab in her chart.Advised I will send message to him.

## 2018-12-12 MED ORDER — REPATHA SURECLICK 140 MG/ML ~~LOC~~ SOAJ: 1.0000 | SUBCUTANEOUS | 11 refills | Status: DC

## 2018-12-12 NOTE — Telephone Encounter (Signed)
Patient advised of Dr. Lysbeth Penner recommendation for medication. She agrees w/plan. She has insurance thru the [place] and does not know her drug coverage info. Will call [place] for assistance and they advised that I just go ahead and submit the Rx for processing.

## 2018-12-13 ENCOUNTER — Other Ambulatory Visit: Payer: Self-pay

## 2018-12-13 ENCOUNTER — Telehealth (INDEPENDENT_AMBULATORY_CARE_PROVIDER_SITE_OTHER): Payer: No Typology Code available for payment source | Admitting: Cardiology

## 2018-12-13 ENCOUNTER — Encounter: Payer: Self-pay | Admitting: Cardiology

## 2018-12-13 VITALS — BP 153/102 | HR 53 | Wt 169.0 lb

## 2018-12-13 DIAGNOSIS — I10 Essential (primary) hypertension: Secondary | ICD-10-CM | POA: Diagnosis not present

## 2018-12-13 DIAGNOSIS — E782 Mixed hyperlipidemia: Secondary | ICD-10-CM

## 2018-12-13 DIAGNOSIS — R072 Precordial pain: Secondary | ICD-10-CM

## 2018-12-13 DIAGNOSIS — E785 Hyperlipidemia, unspecified: Secondary | ICD-10-CM

## 2018-12-13 DIAGNOSIS — I2510 Atherosclerotic heart disease of native coronary artery without angina pectoris: Secondary | ICD-10-CM

## 2018-12-13 NOTE — Patient Instructions (Signed)
Medication Instructions:  Your physician recommends that you continue on your current medications as directed. Please refer to the Current Medication list given to you today.  If you need a refill on your cardiac medications before your next appointment, please call your pharmacy.   Lab work: Your physician recommends that you return for lab work on within 1 week: Bmp, cbc   If you have labs (blood work) drawn today and your tests are completely normal, you will receive your results only by: [name] MyChart Message (if you have MyChart) OR . A paper copy in the mail If you have any lab test that is abnormal or we need to change your treatment, we will call you to review the results.  Testing/Procedures: A chest x-ray takes a picture of the organs and structures inside the chest, including the heart, lungs, and blood vessels. This test can show several things, including, whether the heart is enlarges; whether fluid is building up in the lungs; and whether pacemaker / defibrillator leads are still in plac e.      [facility] [address] [place], [place] [address] Dept: [phone] Loc: [place]  12/13/2018  You are scheduled for a Cardiac Catheterization on Wednesday, August 5 with Dr. Glenetta Hew.  1. Please arrive at the [facility] (Main Entrance A) at [facility]: [address] [place], [place] [address] at 6:30 AM (This time is two hours before your procedure to ensure your preparation). Free valet parking service is available.   Special note: Every effort is made to have your procedure done on time. Please understand that emergencies sometimes delay scheduled procedures.  2. Diet: Do not eat solid foods after midnight.  The patient may have clear liquids until 5am upon the day of the procedure.  3. Labs: You will need to have blood drawn within 1 week.  4.  Medication instructions in preparation for your procedure:     On the morning of your procedure, take your Aspirin and any morning medicines NOT listed above.  You may use sips of water.  5. Plan for one night stay--bring personal belongings. 6. Bring a current list of your medications and current insurance cards. 7. You MUST have a responsible person to drive you home. 8. Someone MUST be with you the first 24 hours after you arrive home or your discharge will be delayed. 9. Please wear clothes that are easy to get on and off and wear slip-on shoes.  Thank you for allowing [place] to care for you!   -- [facility]   Follow-Up: At [facility], you and your health needs are our priority.  As part of our continuing mission to provide you with exceptional heart care, we have created designated Provider Care Teams.  These Care Teams include your primary Cardiologist (physician) and Advanced Practice Providers (APPs -  Physician Assistants and Nurse Practitioners) who all work together to provide you with the care you need, when you need it. You will need a follow up appointment in 1 months.  Please call our office 2 months in advance to schedule this appointment.  You may see No primary care provider on file. or another member of our [employer] Provider Team in [place]: Shirlee More, MD . Jyl Heinz, MD  Any Other Special Instructions Will Be Listed Below (If Applicable).   Chest X-Ray A chest X-ray is  a painless test that uses radiation to create images of the structures inside of your chest. Chest X-rays are used to look for many health conditions, including heart failure, pneumonia, tuberculosis, rib fractures, breathing disorders, and cancer. They may be used to diagnose chest pain, constant coughing, or trouble breathing. Tell a health care provider about:  Any allergies you have.  All medicines you are taking, including vitamins, herbs, eye  drops, creams, and over-the-counter medicines.  Any surgeries you have had.  Any medical conditions you have.  Whether you are pregnant or may be pregnant. What are the risks? Getting a chest X-ray is a safe procedure. However, you will be exposed to a small amount of radiation. Being exposed to too much radiation over a lifetime can increase the risk of cancer. This risk is small, but it may occur if you have many X-rays throughout your life. What happens before the procedure?  You may be asked to remove glasses, jewelry, and any other metal objects.  You will be asked to undress from the waist up. You may be given a hospital gown to wear.  You may be asked to wear a protective lead apron to protect parts of your body from radiation. What happens during the procedure?   You will be asked to stand still as each picture is taken to get the best possible images.  You will be asked to take a deep breath and hold your breath for a few seconds.  The X-ray machine will create a picture of your chest using a tiny burst of radiation. This is painless.  More pictures may be taken from other angles. Typically, one picture will be taken while you face the X-ray camera, and another picture will be taken from the side while you stand. If you cannot stand, you may be asked to lie down. The procedure may vary among health care providers and hospitals. What happens after the procedure?  The X-ray(s) will be reviewed by your health care provider or an X-ray (radiology) specialist.  It is up to you to get your test results. Ask your health care provider, or the department that is doing the test, when your results will be ready.  Your health care provider will tell you if you need more tests or a follow-up exam. Keep all follow-up visits as told by your health care provider. This is important. Summary  A chest X-ray is a safe, painless test that is used to examine the inside of the chest, heart,  and lungs.  You will need to undress from the waist up and remove jewelry and metal objects before the procedure.  You will be exposed to a small amount of radiation during the procedure.  The X-ray machine will take one or more pictures of your chest while you remain as still as possible.  Later, a health care provider or specialist will review the test results with you. This information is not intended to replace advice given to you by your health care provider. Make sure you discuss any questions you have with your health care provider. Document Released: 06/29/2016 Document Revised: 08/23/2018 Document Reviewed: 06/29/2016 Elsevier Patient Education  [employer].    Coronary Angiogram With Stent Coronary angiogram with stent placement is a procedure to widen or open a narrow blood vessel of the heart (coronary artery). Arteries may become blocked by cholesterol buildup (plaques) in the lining of the wall. When a coronary artery becomes partially blocked, blood flow to that area  decreases. This may lead to chest pain or a heart attack (myocardial infarction). A stent is a small piece of metal that looks like mesh or a spring. Stent placement may be done as treatment for a heart attack or right after a coronary angiogram in which a blocked artery is found. Let your health care provider know about: Any allergies you have. All medicines you are taking, including vitamins, herbs, eye drops, creams, and over-the-counter medicines. Any problems you or family members have had with anesthetic medicines. Any blood disorders you have. Any surgeries you have had. Any medical conditions you have. Whether you are pregnant or may be pregnant. What are the risks? Generally, this is a safe procedure. However, problems may occur, including: Damage to the heart or its blood vessels. A return of blockage. Bleeding, infection, or bruising at the insertion site. A collection of blood under the  skin (hematoma) at the insertion site. A blood clot in another part of the body. Kidney injury. Allergic reaction to the dye or contrast that is used. Bleeding into the abdomen (retroperitoneal bleeding). What happens before the procedure? Staying hydrated Follow instructions from your health care provider about hydration, which may include: Up to 2 hours before the procedure - you may continue to drink clear liquids, such as water, clear fruit juice, black coffee, and plain tea.  Eating and drinking restrictions Follow instructions from your health care provider about eating and drinking, which may include: 8 hours before the procedure - stop eating heavy meals or foods such as meat, fried foods, or fatty foods. 6 hours before the procedure - stop eating light meals or foods, such as toast or cereal. 2 hours before the procedure - stop drinking clear liquids. Ask your health care provider about: Changing or stopping your regular medicines. This is especially important if you are taking diabetes medicines or blood thinners. Taking medicines such as ibuprofen. These medicines can thin your blood. Do not take these medicines before your procedure if your health care provider instructs you not to. Generally, aspirin is recommended before a procedure of passing a small, thin tube (catheter) through a blood vessel and into the heart (cardiac catheterization). What happens during the procedure?  An IV tube will be inserted into one of your veins. You will be given one or more of the following: A medicine to help you relax (sedative). A medicine to numb the area where the catheter will be inserted into an artery (local anesthetic). To reduce your risk of infection: Your health care team will wash or sanitize their hands. Your skin will be washed with soap. Hair may be removed from the area where the catheter will be inserted. Using a guide wire, the catheter will be inserted into an artery. The  location may be in your groin, in your wrist, or in the fold of your arm (near your elbow). A type of X-ray (fluoroscopy) will be used to help guide the catheter to the opening of the arteries in the heart. A dye will be injected into the catheter, and X-rays will be taken. The dye will help to show where any narrowing or blockages are located in the arteries. A tiny wire will be guided to the blocked spot, and a balloon will be inflated to make the artery wider. The stent will be expanded and will crush the plaques into the wall of the vessel. The stent will hold the area open and improve the blood flow. Most stents have a drug coating  to reduce the risk of the stent narrowing over time. The artery may be made wider using a drill, laser, or other tools to remove plaques. When the blood flow is better, the catheter will be removed. The lining of the artery will grow over the stent, which stays where it was placed. This procedure may vary among health care providers and hospitals. What happens after the procedure? If the procedure is done through the leg, you will be kept in bed lying flat for about 6 hours. You will be instructed to not bend and not cross your legs. The insertion site will be checked frequently. The pulse in your foot or wrist will be checked frequently. You may have additional blood tests, X-rays, and a test that records the electrical activity of your heart (electrocardiogram, or ECG). This information is not intended to replace advice given to you by your health care provider. Make sure you discuss any questions you have with your health care provider. Document Released: 11/07/2002 Document Revised: 08/12/2017 Document Reviewed: 12/07/2015 Elsevier Patient Education  2020 [employer].

## 2018-12-13 NOTE — Telephone Encounter (Signed)
Patient called to inform that Rx was sent to [facility]. She will check with them on status of shipment of medication. Advised patient to call if she is unable to obtain d/t insurance coverage.   Explained to patient how medication/pen injector works and that she will need a sharps container for proper disposal of pen injector. She has self-administered medications previously.   No further assistance needed at this time.

## 2018-12-13 NOTE — Progress Notes (Signed)
Virtual Visit via Telephone Note   This visit type was conducted due to national recommendations for restrictions regarding the COVID-19 Pandemic (e.g. social distancing) in an effort to limit this patient's exposure and mitigate transmission in our community.  Due to her co-morbid illnesses, this patient is at least at moderate risk for complications without adequate follow up.  This format is felt to be most appropriate for this patient at this time.  The patient did not have access to video technology/had technical difficulties with video requiring transitioning to audio format only (telephone).  All issues noted in this document were discussed and addressed.  No physical exam could be performed with this format.  Please refer to the patient's chart for her  consent to telehealth for [facility].  Evaluation Performed:  Follow-up visit  This visit type was conducted due to national recommendations for restrictions regarding the COVID-19 Pandemic (e.g. social distancing).  This format is felt to be most appropriate for this patient at this time.  All issues noted in this document were discussed and addressed.  No physical exam was performed (except for noted visual exam findings with Video Visits).  Please refer to the patient's chart (MyChart message for video visits and phone note for telephone visits) for the patient's consent to telehealth for [facility].  Date:  12/13/2018  ID: Penny Boyer, DOB Feb 19, 1956, MRN [id number]   Patient Location: [place] [address]   Provider location:   [place] Office  PCP:  [place]  Cardiologist:  Jenne Campus, MD     Chief Complaint: I am here to talk about my problems  History of Present Illness:    Penny Boyer is a 63 y.o. female  who presents via audio/video conferencing for a telehealth visit today.  Past medical history significant for coronary artery disease PTCA and  stenting to left circumflex artery in March 2017 done in [place], also hypertension dyslipidemia.  She came to me complaining of having some chest pain.  Gradually tried to put her on right medication eventually we elected to proceed with stress testing.  Stress test came abnormal showing ischemia involving LAD territory.  I talked to her over the phone try to make Some plans.  Overall she still described to have some chest pain.  In spite of isosorbide mononitrate as well as ranolazine.  We talked about potentially augmenting her medical therapy which is already quite aggressive as well as proceeding with cardiac catheterization.  We reach conclusion that cardiac catheterization is the best way to proceed.  Procedure was explained to her including all risk benefits as well as alternatives.  We will proceed with cardiac catheterization.   The patient does not have symptoms concerning for COVID-19 infection (fever, chills, cough, or new SHORTNESS OF BREATH).    Prior CV studies:   The following studies were reviewed today:   Nuclear stress EF: 67%.  The left ventricular ejection fraction is hyperdynamic (>65%).  T wave inversion was noted during stress in the V4, V5 and V6 leads, beginning at 1 minutes of stress, ending at 3 minutes of stress, and returning to baseline after 1-5 mins of recovery.  Defect 1: There is a medium defect of moderate severity present in the basal anterior, mid anterior and apical anterior location.  Findings consistent with ischemia.  This is a high risk study.  Ischemia in LAD teritory.     Past Medical History:  Diagnosis  Date  . Coronary artery disease involving native coronary artery has post PTCA and stenting of the circumflex artery in March 2017 of native heart 07/28/2016   Overview:  PTCA and stent of LCX artery in March 2017 in [place]  . Dyslipidemia (high LDL; low HDL) 07/28/2016  . Hyperlipidemia   . Hypertension   . Precordial pain 07/28/2016        Current Meds  Medication Sig  . aspirin (GOODSENSE ASPIRIN) 325 MG tablet Take 325 mg by mouth daily.  . isosorbide mononitrate (IMDUR) 30 MG 24 hr tablet Take 30 mg by mouth daily.   [name] losartan (COZAAR) 100 MG tablet Take 100 mg by mouth daily.  . nitroGLYCERIN (NITROSTAT) 0.4 MG SL tablet Place 1 tablet (0.4 mg total) under the tongue as needed for chest pain.  [name] propranolol ER (INDERAL LA) 120 MG 24 hr capsule Take 120 mg by mouth daily.  . ranolazine (RANEXA) 1000 MG SR tablet Take 1 tablet (1,000 mg total) by mouth 2 (two) times daily.      Family History: The patient's family history includes Cancer in her mother; Heart disease in her brother, brother, and father.   ROS:   Please see the history of present illness.     All other systems reviewed and are negative.   Labs/Other Tests and Data Reviewed:     Recent Labs: No results found for requested labs within last 8760 hours.  Recent Lipid Panel    Component Value Date/Time   CHOL 211 (H) 12/06/2018 1008   TRIG 137 12/06/2018 1008   HDL 45 12/06/2018 1008   CHOLHDL 4.7 (H) 12/06/2018 1008   LDLCALC 139 (H) 12/06/2018 1008      Exam:    Vital Signs:  BP (!) 153/102   Pulse (!) 53   Wt 169 lb (76.7 kg)   BMI 31.93 kg/m     Wt Readings from Last 3 Encounters:  12/13/18 169 lb (76.7 kg)  12/06/18 182 lb (82.6 kg)  11/15/18 175 lb (79.4 kg)     Well nourished, well developed in no acute distress. Alert awake oriented x3 talking to me over the phone.  Unable to establish video link.  Diagnosis for this visit:   1. Essential hypertension   2. Coronary artery disease involving native coronary artery of native heart without angina pectoris   3. Dyslipidemia (high LDL; low HDL)   4. Mixed hyperlipidemia   5. Precordial pain      ASSESSMENT & PLAN:    1.  Essential hypertension blood pressure controlled continue present management. 2.  Coronary artery disease with abnormal stress test.  We  will proceed with cardiac catheterization.  She does have ischemia in LAD territory and stress test.  She is already on the ranolazine as well as Imdur in spite of that she is still symptomatic. 3.  Dyslipidemia: She was referred to lipid clinic awaiting approval point for PCSK9 agent. 4.  Precordial chest pain proceed with cardiac catheterization  COVID-19 Education: The signs and symptoms of COVID-19 were discussed with the patient and how to seek care for testing (follow up with PCP or arrange E-visit).  The importance of social distancing was discussed today.  Patient Risk:   After full review of this patients clinical status, I feel that they are at least moderate risk at this time.  Time:   Today, I have spent 22 minutes with the patient with telehealth technology discussing pt health issues.  I spent 5  minutes reviewing her chart before the visit.  Visit was finished at 10:45 AM.    Medication Adjustments/Labs and Tests Ordered: Current medicines are reviewed at length with the patient today.  Concerns regarding medicines are outlined above.  Orders Placed This Encounter  Procedures  . DG Chest 2 View  . Basic metabolic panel  . CBC   Medication changes: No orders of the defined types were placed in this encounter.    Disposition: Follow-up 1 month  Signed, Park Liter, MD, [facility] 12/13/2018 12:27 PM    [place]

## 2018-12-13 NOTE — H&P (View-Only) (Signed)
Virtual Visit via Telephone Note   This visit type was conducted due to national recommendations for restrictions regarding the COVID-19 Pandemic (e.g. social distancing) in an effort to limit this patient's exposure and mitigate transmission in our community.  Due to her co-morbid illnesses, this patient is at least at moderate risk for complications without adequate follow up.  This format is felt to be most appropriate for this patient at this time.  The patient did not have access to video technology/had technical difficulties with video requiring transitioning to audio format only (telephone).  All issues noted in this document were discussed and addressed.  No physical exam could be performed with this format.  Please refer to the patient's chart for her  consent to telehealth for [facility].  Evaluation Performed:  Follow-up visit  This visit type was conducted due to national recommendations for restrictions regarding the COVID-19 Pandemic (e.g. social distancing).  This format is felt to be most appropriate for this patient at this time.  All issues noted in this document were discussed and addressed.  No physical exam was performed (except for noted visual exam findings with Video Visits).  Please refer to the patient's chart (MyChart message for video visits and phone note for telephone visits) for the patient's consent to telehealth for [facility].  Date:  12/13/2018  ID: Penny Boyer, DOB 10/25/1955, MRN [id number]   Patient Location: [place] [address]   Provider location:   [place] Office  PCP:  [place]  Cardiologist:  Jenne Campus, MD     Chief Complaint: I am here to talk about my problems  History of Present Illness:    Penny Boyer is a 63 y.o. female  who presents via audio/video conferencing for a telehealth visit today.  Past medical history significant for coronary artery disease PTCA and  stenting to left circumflex artery in March 2017 done in [place], also hypertension dyslipidemia.  She came to me complaining of having some chest pain.  Gradually tried to put her on right medication eventually we elected to proceed with stress testing.  Stress test came abnormal showing ischemia involving LAD territory.  I talked to her over the phone try to make Some plans.  Overall she still described to have some chest pain.  In spite of isosorbide mononitrate as well as ranolazine.  We talked about potentially augmenting her medical therapy which is already quite aggressive as well as proceeding with cardiac catheterization.  We reach conclusion that cardiac catheterization is the best way to proceed.  Procedure was explained to her including all risk benefits as well as alternatives.  We will proceed with cardiac catheterization.   The patient does not have symptoms concerning for COVID-19 infection (fever, chills, cough, or new SHORTNESS OF BREATH).    Prior CV studies:   The following studies were reviewed today:   Nuclear stress EF: 67%.  The left ventricular ejection fraction is hyperdynamic (>65%).  T wave inversion was noted during stress in the V4, V5 and V6 leads, beginning at 1 minutes of stress, ending at 3 minutes of stress, and returning to baseline after 1-5 mins of recovery.  Defect 1: There is a medium defect of moderate severity present in the basal anterior, mid anterior and apical anterior location.  Findings consistent with ischemia.  This is a high risk study.  Ischemia in LAD teritory.     Past Medical History:  Diagnosis  Date  . Coronary artery disease involving native coronary artery has post PTCA and stenting of the circumflex artery in March 2017 of native heart 07/28/2016   Overview:  PTCA and stent of LCX artery in March 2017 in [place]  . Dyslipidemia (high LDL; low HDL) 07/28/2016  . Hyperlipidemia   . Hypertension   . Precordial pain 07/28/2016        Current Meds  Medication Sig  . aspirin (GOODSENSE ASPIRIN) 325 MG tablet Take 325 mg by mouth daily.  . isosorbide mononitrate (IMDUR) 30 MG 24 hr tablet Take 30 mg by mouth daily.   [name] losartan (COZAAR) 100 MG tablet Take 100 mg by mouth daily.  . nitroGLYCERIN (NITROSTAT) 0.4 MG SL tablet Place 1 tablet (0.4 mg total) under the tongue as needed for chest pain.  [name] propranolol ER (INDERAL LA) 120 MG 24 hr capsule Take 120 mg by mouth daily.  . ranolazine (RANEXA) 1000 MG SR tablet Take 1 tablet (1,000 mg total) by mouth 2 (two) times daily.      Family History: The patient's family history includes Cancer in her mother; Heart disease in her brother, brother, and father.   ROS:   Please see the history of present illness.     All other systems reviewed and are negative.   Labs/Other Tests and Data Reviewed:     Recent Labs: No results found for requested labs within last 8760 hours.  Recent Lipid Panel    Component Value Date/Time   CHOL 211 (H) 12/06/2018 1008   TRIG 137 12/06/2018 1008   HDL 45 12/06/2018 1008   CHOLHDL 4.7 (H) 12/06/2018 1008   LDLCALC 139 (H) 12/06/2018 1008      Exam:    Vital Signs:  BP (!) 153/102   Pulse (!) 53   Wt 169 lb (76.7 kg)   BMI 31.93 kg/m     Wt Readings from Last 3 Encounters:  12/13/18 169 lb (76.7 kg)  12/06/18 182 lb (82.6 kg)  11/15/18 175 lb (79.4 kg)     Well nourished, well developed in no acute distress. Alert awake oriented x3 talking to me over the phone.  Unable to establish video link.  Diagnosis for this visit:   1. Essential hypertension   2. Coronary artery disease involving native coronary artery of native heart without angina pectoris   3. Dyslipidemia (high LDL; low HDL)   4. Mixed hyperlipidemia   5. Precordial pain      ASSESSMENT & PLAN:    1.  Essential hypertension blood pressure controlled continue present management. 2.  Coronary artery disease with abnormal stress test.  We  will proceed with cardiac catheterization.  She does have ischemia in LAD territory and stress test.  She is already on the ranolazine as well as Imdur in spite of that she is still symptomatic. 3.  Dyslipidemia: She was referred to lipid clinic awaiting approval point for PCSK9 agent. 4.  Precordial chest pain proceed with cardiac catheterization  COVID-19 Education: The signs and symptoms of COVID-19 were discussed with the patient and how to seek care for testing (follow up with PCP or arrange E-visit).  The importance of social distancing was discussed today.  Patient Risk:   After full review of this patients clinical status, I feel that they are at least moderate risk at this time.  Time:   Today, I have spent 22 minutes with the patient with telehealth technology discussing pt health issues.  I spent 5  minutes reviewing her chart before the visit.  Visit was finished at 10:45 AM.    Medication Adjustments/Labs and Tests Ordered: Current medicines are reviewed at length with the patient today.  Concerns regarding medicines are outlined above.  Orders Placed This Encounter  Procedures  . DG Chest 2 View  . Basic metabolic panel  . CBC   Medication changes: No orders of the defined types were placed in this encounter.    Disposition: Follow-up 1 month  Signed, Park Liter, MD, [facility] 12/13/2018 12:27 PM    [place]

## 2018-12-14 LAB — CBC
Hematocrit: 39 % (ref 34.0–46.6)
Hemoglobin: 13.4 g/dL (ref 11.1–15.9)
MCH: 30.3 pg (ref 26.6–33.0)
MCHC: 34.4 g/dL (ref 31.5–35.7)
MCV: 88 fL (ref 79–97)
Platelets: 254 10*3/uL (ref 150–450)
RBC: 4.42 x10E6/uL (ref 3.77–5.28)
RDW: 14.8 % (ref 11.7–15.4)
WBC: 6.3 10*3/uL (ref 3.4–10.8)

## 2018-12-14 LAB — BASIC METABOLIC PANEL
BUN/Creatinine Ratio: 22 (ref 12–28)
BUN: 18 mg/dL (ref 8–27)
CO2: 20 mmol/L (ref 20–29)
Calcium: 9.7 mg/dL (ref 8.7–10.3)
Chloride: 103 mmol/L (ref 96–106)
Creatinine, Ser: 0.82 mg/dL (ref 0.57–1.00)
GFR calc Af Amer: 89 mL/min/{1.73_m2} (ref >59)
GFR calc non Af Amer: 77 mL/min/{1.73_m2} (ref >59)
Glucose: 93 mg/dL (ref 65–99)
Potassium: 3.7 mmol/L (ref 3.5–5.2)
Sodium: 142 mmol/L (ref 134–144)

## 2018-12-15 ENCOUNTER — Telehealth: Payer: Self-pay | Admitting: *Deleted

## 2018-12-15 NOTE — Telephone Encounter (Signed)
Notes recorded by Ashok Norris, RN on 12/15/2018 at 3:08 PM EDT  Attempted to contact patient with results no answer and voicemail was full. Will continue efforts.  ------   Notes recorded by Park Liter, MD on 12/15/2018 at 1:09 PM EDT  Labs are good, cont same   Pt called back and let her know about labs, she verbalized understanding

## 2018-12-16 ENCOUNTER — Other Ambulatory Visit (HOSPITAL_COMMUNITY)
Admission: RE | Admit: 2018-12-16 | Discharge: 2018-12-16 | Disposition: A | Discharge status: Home / Self Care | Payer: No Typology Code available for payment source | Source: Ambulatory Visit | Attending: Cardiology | Admitting: Cardiology

## 2018-12-16 DIAGNOSIS — Z01812 Encounter for preprocedural laboratory examination: Secondary | ICD-10-CM | POA: Diagnosis present

## 2018-12-16 DIAGNOSIS — Z20828 Contact with and (suspected) exposure to other viral communicable diseases: Secondary | ICD-10-CM | POA: Insufficient documentation

## 2018-12-16 LAB — SARS CORONAVIRUS 2 (TAT 6-24 HRS): SARS Coronavirus 2: NEGATIVE

## 2018-12-19 ENCOUNTER — Telehealth: Payer: Self-pay | Admitting: *Deleted

## 2018-12-19 NOTE — Telephone Encounter (Signed)
Pt contacted pre-catheterization scheduled at [facility] for: Wednesday August 5,2020 8:30 AM Verified arrival time and place: [facility]) at: 6:30 AM   No solid food after midnight prior to cath, clear liquids until 5 AM day of procedure. Contrast allergy: no  AM meds can be  taken pre-cath with sip of water including: ASA 81 mg   Confirmed patient has responsible person to drive home post procedure and observe 24 hours after arriving home: yes  Due to Covid-19 pandemic, only one support person will be allowed with patient. Must be the same support person for that patient's entire stay, will be screened and required to wear a mask.   Patients are required to wear a mask when they enter the hospital.      COVID-19 Pre-Screening Questions:  . In the past 7 to 10 days have you had a cough,  shortness of breath, headache, congestion, fever (100 or greater) body aches, chills, sore throat, or sudden loss of taste or sense of smell? no . Have you been around anyone with known Covid 19? no . Have you been around anyone who is awaiting Covid 19 test results in the past 7 to 10 days? no . Have you been around anyone who has been exposed to Covid 19, or has mentioned symptoms of Covid 19 within the past 7 to 10 days? no   I reviewed procedure/mask/visitor, Covid-19 screening questions with patient, she verbalized understanding, thanked me for call.

## 2018-12-20 ENCOUNTER — Other Ambulatory Visit: Payer: Self-pay

## 2018-12-20 ENCOUNTER — Ambulatory Visit (HOSPITAL_COMMUNITY)
Admission: RE | Admit: 2018-12-20 | Discharge: 2018-12-20 | Disposition: A | Discharge status: Home / Self Care | Payer: No Typology Code available for payment source | Attending: Cardiology | Admitting: Cardiology

## 2018-12-20 ENCOUNTER — Encounter (HOSPITAL_COMMUNITY)
Admission: RE | Disposition: A | Discharge status: Home / Self Care | Payer: No Typology Code available for payment source | Source: Home / Self Care | Attending: Cardiology

## 2018-12-20 DIAGNOSIS — I10 Essential (primary) hypertension: Secondary | ICD-10-CM | POA: Insufficient documentation

## 2018-12-20 DIAGNOSIS — Z7982 Long term (current) use of aspirin: Secondary | ICD-10-CM | POA: Insufficient documentation

## 2018-12-20 DIAGNOSIS — Z8249 Family history of ischemic heart disease and other diseases of the circulatory system: Secondary | ICD-10-CM | POA: Insufficient documentation

## 2018-12-20 DIAGNOSIS — R9439 Abnormal result of other cardiovascular function study: Secondary | ICD-10-CM | POA: Diagnosis not present

## 2018-12-20 DIAGNOSIS — E782 Mixed hyperlipidemia: Secondary | ICD-10-CM | POA: Insufficient documentation

## 2018-12-20 DIAGNOSIS — I200 Unstable angina: Secondary | ICD-10-CM | POA: Diagnosis present

## 2018-12-20 DIAGNOSIS — I25119 Atherosclerotic heart disease of native coronary artery with unspecified angina pectoris: Secondary | ICD-10-CM | POA: Diagnosis not present

## 2018-12-20 DIAGNOSIS — I25110 Atherosclerotic heart disease of native coronary artery with unstable angina pectoris: Secondary | ICD-10-CM | POA: Diagnosis not present

## 2018-12-20 DIAGNOSIS — E785 Hyperlipidemia, unspecified: Secondary | ICD-10-CM | POA: Diagnosis present

## 2018-12-20 DIAGNOSIS — I2510 Atherosclerotic heart disease of native coronary artery without angina pectoris: Secondary | ICD-10-CM | POA: Diagnosis present

## 2018-12-20 DIAGNOSIS — Z79899 Other long term (current) drug therapy: Secondary | ICD-10-CM | POA: Diagnosis not present

## 2018-12-20 DIAGNOSIS — Z955 Presence of coronary angioplasty implant and graft: Secondary | ICD-10-CM | POA: Diagnosis not present

## 2018-12-20 DIAGNOSIS — R55 Syncope and collapse: Secondary | ICD-10-CM | POA: Diagnosis present

## 2018-12-20 HISTORY — PX: INTRAVASCULAR PRESSURE WIRE/FFR STUDY: CATH118243

## 2018-12-20 HISTORY — DX: Abnormal result of other cardiovascular function study: R94.39

## 2018-12-20 HISTORY — PX: LEFT HEART CATH AND CORONARY ANGIOGRAPHY: CATH118249

## 2018-12-20 LAB — POCT ACTIVATED CLOTTING TIME: Activated Clotting Time: 268 seconds

## 2018-12-20 SURGERY — LEFT HEART CATH AND CORONARY ANGIOGRAPHY
Anesthesia: LOCAL

## 2018-12-20 MED ORDER — LIDOCAINE HCL (PF) 1 % IJ SOLN
INTRAMUSCULAR | Status: AC
  Filled 2018-12-20: qty 30

## 2018-12-20 MED ORDER — ADENOSINE (DIAGNOSTIC) 140MCG/KG/MIN
INTRAVENOUS | Status: DC | PRN
  Administered 2018-12-20: 140 ug/kg/min via INTRAVENOUS

## 2018-12-20 MED ORDER — IOHEXOL 350 MG/ML SOLN
INTRAVENOUS | Status: DC | PRN
  Administered 2018-12-20: 130 mL via INTRA_ARTERIAL

## 2018-12-20 MED ORDER — MIDAZOLAM HCL 2 MG/2ML IJ SOLN
INTRAMUSCULAR | Status: AC
  Filled 2018-12-20: qty 2

## 2018-12-20 MED ORDER — HEPARIN SODIUM (PORCINE) 1000 UNIT/ML IJ SOLN
INTRAMUSCULAR | Status: AC
  Filled 2018-12-20: qty 1

## 2018-12-20 MED ORDER — ASPIRIN 81 MG PO CHEW: 81.0000 mg | CHEWABLE_TABLET | ORAL | Status: DC

## 2018-12-20 MED ORDER — MIDAZOLAM HCL 2 MG/2ML IJ SOLN
INTRAMUSCULAR | Status: DC | PRN
  Administered 2018-12-20: 2 mg via INTRAVENOUS
  Administered 2018-12-20: 1 mg via INTRAVENOUS

## 2018-12-20 MED ORDER — SODIUM CHLORIDE 0.9 % WEIGHT BASED INFUSION: 1.0000 mL/kg/h | INTRAVENOUS | Status: DC

## 2018-12-20 MED ORDER — SODIUM CHLORIDE 0.9 % WEIGHT BASED INFUSION
3.0000 mL/kg/h | INTRAVENOUS | Status: AC
  Administered 2018-12-20: 3 mL/kg/h via INTRAVENOUS

## 2018-12-20 MED ORDER — FENTANYL CITRATE (PF) 100 MCG/2ML IJ SOLN
INTRAMUSCULAR | Status: AC
  Filled 2018-12-20: qty 2

## 2018-12-20 MED ORDER — VERAPAMIL HCL 2.5 MG/ML IV SOLN
INTRAVENOUS | Status: DC | PRN
  Administered 2018-12-20: 09:00:00 10 mL via INTRA_ARTERIAL

## 2018-12-20 MED ORDER — HEPARIN (PORCINE) IN NACL 1000-0.9 UT/500ML-% IV SOLN
INTRAVENOUS | Status: DC | PRN
  Administered 2018-12-20 (×2): 500 mL

## 2018-12-20 MED ORDER — SODIUM CHLORIDE 0.9% FLUSH: 3.0000 mL | INTRAVENOUS | Status: DC | PRN

## 2018-12-20 MED ORDER — ADENOSINE 12 MG/4ML IV SOLN
INTRAVENOUS | Status: AC
  Filled 2018-12-20: qty 16

## 2018-12-20 MED ORDER — HEPARIN SODIUM (PORCINE) 1000 UNIT/ML IJ SOLN
INTRAMUSCULAR | Status: DC | PRN
  Administered 2018-12-20: 5000 [IU] via INTRAVENOUS
  Administered 2018-12-20: 2000 [IU] via INTRAVENOUS
  Administered 2018-12-20: 4000 [IU] via INTRAVENOUS

## 2018-12-20 MED ORDER — HEPARIN (PORCINE) IN NACL 1000-0.9 UT/500ML-% IV SOLN
INTRAVENOUS | Status: AC
  Filled 2018-12-20: qty 1000

## 2018-12-20 MED ORDER — SODIUM CHLORIDE 0.9 % IV SOLN: 250.0000 mL | INTRAVENOUS | Status: DC | PRN

## 2018-12-20 MED ORDER — VERAPAMIL HCL 2.5 MG/ML IV SOLN
INTRAVENOUS | Status: AC
  Filled 2018-12-20: qty 2

## 2018-12-20 MED ORDER — LIDOCAINE HCL (PF) 1 % IJ SOLN
INTRAMUSCULAR | Status: DC | PRN
  Administered 2018-12-20: 2 mL

## 2018-12-20 MED ORDER — SODIUM CHLORIDE 0.9% FLUSH: 3.0000 mL | Freq: Two times a day (BID) | INTRAVENOUS | Status: DC

## 2018-12-20 MED ORDER — FENTANYL CITRATE (PF) 100 MCG/2ML IJ SOLN
INTRAMUSCULAR | Status: DC | PRN
  Administered 2018-12-20 (×2): 25 ug via INTRAVENOUS

## 2018-12-20 SURGICAL SUPPLY — 14 items
CATH INFINITI 5FR ANG PIGTAIL (CATHETERS) ×2 IMPLANT
CATH LAUNCHER 6FR EBU 3 (CATHETERS) ×2 IMPLANT
CATH OPTITORQUE TIG 4.0 6F (CATHETERS) ×2 IMPLANT
DEVICE RAD COMP TR BAND LRG (VASCULAR PRODUCTS) ×2 IMPLANT
GLIDESHEATH SLEND A-KIT 6F 22G (SHEATH) ×2 IMPLANT
GUIDEWIRE INQWIRE 1.5J.035X260 (WIRE) ×1 IMPLANT
GUIDEWIRE PRESSURE COMET II (WIRE) ×2 IMPLANT
INQWIRE 1.5J .035X260CM (WIRE) ×2
KIT ESSENTIALS PG (KITS) ×2 IMPLANT
KIT HEART LEFT (KITS) ×2 IMPLANT
PACK CARDIAC CATHETERIZATION (CUSTOM PROCEDURE TRAY) ×2 IMPLANT
SYR MEDRAD MARK 7 150ML (SYRINGE) ×2 IMPLANT
TRANSDUCER W/STOPCOCK (MISCELLANEOUS) ×2 IMPLANT
TUBING CIL FLEX 10 FLL-RA (TUBING) ×2 IMPLANT

## 2018-12-20 NOTE — Interval H&P Note (Signed)
History and Physical Interval Note:  12/20/2018 8:16 AM  Penny Boyer  has presented today for surgery, with the diagnosis of Abnormal stress test with progressive angina.    The various methods of treatment have been discussed with the patient and family. After consideration of risks, benefits and other options for treatment, the patient has consented to  Procedure(s): LEFT HEART CATH AND CORONARY ANGIOGRAPHY (N/A)  PERCUTANEOUS CORONARY INTERVENTION   as a surgical intervention.  The patient's history has been reviewed, patient examined, no change in status, stable for surgery.  I have reviewed the patient's chart and labs.  Questions were answered to the patient's satisfaction.    Cath Lab Visit (complete for each Cath Lab visit)  Clinical Evaluation Leading to the Procedure:   ACS: No.  Non-ACS:    Anginal Classification: CCS III  Anti-ischemic medical therapy: Maximal Therapy (2 or more classes of medications)  Non-Invasive Test Results: High-risk stress test findings: cardiac mortality >3%/year  Prior CABG: No previous CABG   Glenetta Hew

## 2018-12-20 NOTE — Discharge Instructions (Signed)
Radial Site Care ° °This sheet gives you information about how to care for yourself after your procedure. Your health care provider may also give you more specific instructions. If you have problems or questions, contact your health care provider. °What can I expect after the procedure? °After the procedure, it is common to have: °· Bruising and tenderness at the catheter insertion area. °Follow these instructions at home: °Medicines °· Take over-the-counter and prescription medicines only as told by your health care provider. °Insertion site care °· Follow instructions from your health care provider about how to take care of your insertion site. Make sure you: °? Wash your hands with soap and water before you change your bandage (dressing). If soap and water are not available, use hand sanitizer. °? Change your dressing as told by your health care provider. °? Leave stitches (sutures), skin glue, or adhesive strips in place. These skin closures may need to stay in place for 2 weeks or longer. If adhesive strip edges start to loosen and curl up, you may trim the loose edges. Do not remove adhesive strips completely unless your health care provider tells you to do that. °· Check your insertion site every day for signs of infection. Check for: °? Redness, swelling, or pain. °? Fluid or blood. °? Pus or a bad smell. °? Warmth. °· Do not take baths, swim, or use a hot tub until your health care provider approves. °· You may shower 24-48 hours after the procedure, or as directed by your health care provider. °? Remove the dressing and gently wash the site with plain soap and water. °? Pat the area dry with a clean towel. °? Do not rub the site. That could cause bleeding. °· Do not apply powder or lotion to the site. °Activity ° °· For 24 hours after the procedure, or as directed by your health care provider: °? Do not flex or bend the affected arm. °? Do not push or pull heavy objects with the affected arm. °? Do not  drive yourself home from the hospital or clinic. You may drive 24 hours after the procedure unless your health care provider tells you not to. °? Do not operate machinery or power tools. °· Do not lift anything that is heavier than 10 lb (4.5 kg), or the limit that you are told, until your health care provider says that it is safe. °· Ask your health care provider when it is okay to: °? Return to work or school. °? Resume usual physical activities or sports. °? Resume sexual activity. °General instructions °· If the catheter site starts to bleed, raise your arm and put firm pressure on the site. If the bleeding does not stop, get help right away. This is a medical emergency. °· If you went home on the same day as your procedure, a responsible adult should be with you for the first 24 hours after you arrive home. °· Keep all follow-up visits as told by your health care provider. This is important. °Contact a health care provider if: °· You have a fever. °· You have redness, swelling, or yellow drainage around your insertion site. °Get help right away if: °· You have unusual pain at the radial site. °· The catheter insertion area swells very fast. °· The insertion area is bleeding, and the bleeding does not stop when you hold steady pressure on the area. °· Your arm or hand becomes pale, cool, tingly, or numb. °These symptoms may represent a serious problem   that is an emergency. Do not wait to see if the symptoms will go away. Get medical help right away. Call your local emergency services (911 in the [place] Do not drive yourself to the hospital. °Summary °· After the procedure, it is common to have bruising and tenderness at the site. °· Follow instructions from your health care provider about how to take care of your radial site wound. Check the wound every day for signs of infection. °· Do not lift anything that is heavier than 10 lb (4.5 kg), or the limit that you are told, until your health care provider says  that it is safe. °This information is not intended to replace advice given to you by your health care provider. Make sure you discuss any questions you have with your health care provider. °Document Released: 06/05/2010 Document Revised: 06/08/2017 Document Reviewed: 06/08/2017 °Elsevier Patient Education © 2020 Else[employer] Inc. ° °

## 2018-12-21 ENCOUNTER — Encounter (HOSPITAL_COMMUNITY): Payer: Self-pay | Admitting: Cardiology

## 2019-01-24 ENCOUNTER — Encounter: Payer: Self-pay | Admitting: Cardiology

## 2019-01-24 ENCOUNTER — Ambulatory Visit (INDEPENDENT_AMBULATORY_CARE_PROVIDER_SITE_OTHER): Payer: No Typology Code available for payment source | Admitting: Cardiology

## 2019-01-24 ENCOUNTER — Other Ambulatory Visit: Payer: Self-pay

## 2019-01-24 VITALS — BP 110/78 | HR 68 | Ht 64.0 in | Wt 163.4 lb

## 2019-01-24 DIAGNOSIS — E785 Hyperlipidemia, unspecified: Secondary | ICD-10-CM

## 2019-01-24 DIAGNOSIS — I10 Essential (primary) hypertension: Secondary | ICD-10-CM | POA: Diagnosis not present

## 2019-01-24 DIAGNOSIS — I25118 Atherosclerotic heart disease of native coronary artery with other forms of angina pectoris: Secondary | ICD-10-CM

## 2019-01-24 NOTE — Patient Instructions (Signed)
Medication Instructions:  Your physician recommends that you continue on your current medications as directed. Please refer to the Current Medication list given to you today.  If you need a refill on your cardiac medications before your next appointment, please call your pharmacy.   Lab work: Your physician recommends that you return for lab work today: lipids   If you have labs (blood work) drawn today and your tests are completely normal, you will receive your results only by: [name] MyChart Message (if you have MyChart) OR . A paper copy in the mail If you have any lab test that is abnormal or we need to change your treatment, we will call you to review the results.  Testing/Procedures: None.   Follow-Up: At [facility], you and your health needs are our priority.  As part of our continuing mission to provide you with exceptional heart care, we have created designated Provider Care Teams.  These Care Teams include your primary Cardiologist (physician) and Advanced Practice Providers (APPs -  Physician Assistants and Nurse Practitioners) who all work together to provide you with the care you need, when you need it. You will need a follow up appointment in 3 months.  Please call our office 2 months in advance to schedule this appointment.  You may see No primary care provider on file. or another member of our [employer] Provider Team in [place]: Shirlee More, MD . Jyl Heinz, MD  Any Other Special Instructions Will Be Listed Below (If Applicable).

## 2019-01-24 NOTE — Progress Notes (Signed)
Cardiology Office Note:    Date:  01/24/2019   ID:  Penny Boyer, DOB 01-Oct-1955, MRN [id number]  PCP:  [place]  Cardiologist:  Jenne Campus, MD    Referring MD: [place]   Chief Complaint  Patient presents with  . Follow-up  Doing well  History of Present Illness:    Penny Boyer is a 63 y.o. female who is referred to [place] because of atypical chest pain stress test was done which showed ischemia in LAD territory.  After that she had cardiac catheterization done cardiac catheterization however showed some diagonal branch disease LAD and did not have critical stenosis.  Decision was made to manage this medically.  She is doing well she actually lost 50 pounds.  She did ask to diet.  Overall described to have rare episodes of chest pain.  Does not exercise on the regular basis yet.  Past Medical History:  Diagnosis Date  . Coronary artery disease involving native coronary artery has post PTCA and stenting of the circumflex artery in March 2017 of native heart 07/28/2016   Overview:  PTCA and stent of LCX artery in March 2017 in [place]  . Dyslipidemia (high LDL; low HDL) 07/28/2016  . Hyperlipidemia   . Hypertension   . Precordial pain 07/28/2016      Current Medications: Current Meds  Medication Sig  . albuterol (VENTOLIN HFA) 108 (90 Base) MCG/ACT inhaler Inhale 2 puffs into the lungs every 6 (six) hours as needed for wheezing or shortness of breath.  [name] aspirin (GOODSENSE ASPIRIN) 325 MG tablet Take 325 mg by mouth at bedtime.   . Evolocumab (REPATHA SURECLICK) XX123456 MG/ML SOAJ Inject 1 Dose into the skin every 14 (fourteen) days.  . famotidine-calcium carbonate-magnesium hydroxide (PEPCID COMPLETE) 10-800-165 MG chewable tablet Chew 1 tablet by mouth daily as needed (acid reflux).  [name] ibuprofen (ADVIL) 200 MG tablet Take 800 mg by mouth every 6 (six) hours as needed for headache or moderate pain.  . isosorbide mononitrate (IMDUR) 30 MG 24 hr tablet  Take 60 mg by mouth daily.   [name] losartan (COZAAR) 100 MG tablet Take 100 mg by mouth daily.  . nitroGLYCERIN (NITROSTAT) 0.4 MG SL tablet Place 1 tablet (0.4 mg total) under the tongue as needed for chest pain.  [name] propranolol ER (INDERAL LA) 120 MG 24 hr capsule Take 120 mg by mouth daily.  . ranolazine (RANEXA) 1000 MG SR tablet Take 1 tablet (1,000 mg total) by mouth 2 (two) times daily.     Allergies:   Atorvastatin and Hydrochlorothiazide w-triamterene   Social History   Socioeconomic History  . Marital status: Married    Spouse name: Not on file  . Number of children: Not on file  . Years of education: Not on file  . Highest education level: Not on file  Occupational History  . Not on file  Social Needs  . Financial resource strain: Not on file  . Food insecurity    Worry: Not on file    Inability: Not on file  . Transportation needs    Medical: Not on file    Non-medical: Not on file  Tobacco Use  . Smoking status: Former Research scientist (life sciences)  . Smokeless tobacco: Never Used  Substance and Sexual Activity  . Alcohol use: Yes  . Drug use: No  . Sexual activity: Not on file  Lifestyle  . Physical activity    Days per week: Not on file    Minutes per  session: Not on file  . Stress: Not on file  Relationships  . Social Herbalist on phone: Not on file    Gets together: Not on file    Attends religious service: Not on file    Active member of club or organization: Not on file    Attends meetings of clubs or organizations: Not on file    Relationship status: Not on file  Other Topics Concern  . Not on file  Social History Narrative  . Not on file     Family History: The patient's family history includes Cancer in her mother; Heart disease in her brother, brother, and father. ROS:   Please see the history of present illness.    All 14 point review of systems negative except as described per history of present illness  EKGs/Labs/Other Studies Reviewed:    Cardiac  catheterization from August 52,020 showed:  Mid LAD lesion is 60% stenosed - "ostial" lesion @ 2nd Diag  1st Diag lesion is 80% stenosed. Very small vessel - NOT PCI/PTCA target  Prox Cx to Mid Cx Stent is 5% stenosed.  Dist RCA lesion is 20% stenosed. RPDA lesion is 55% stenosed.  --------------------------------  The left ventricular systolic function is normal. The left ventricular ejection fraction is 55-65% by visual estimate.  LV end diastolic pressure is mildly elevated.   SUMMARY  Moderate two-vessel disease with FFR Negative 60% lesion in LAD at D2, distal RCA-RPDA 55% with widely patent stent in proximal LCx.  Normal LVEF with mildly elevated LVEDP.  Likely false positive stress test   Recent Labs: 12/13/2018: BUN 18; Creatinine, Ser 0.82; Hemoglobin 13.4; Platelets 254; Potassium 3.7; Sodium 142  Recent Lipid Panel    Component Value Date/Time   CHOL 211 (H) 12/06/2018 1008   TRIG 137 12/06/2018 1008   HDL 45 12/06/2018 1008   CHOLHDL 4.7 (H) 12/06/2018 1008   LDLCALC 139 (H) 12/06/2018 1008    Physical Exam:    VS:  BP 110/78   Pulse 68   Ht 5\' 4"  (1.626 m)   Wt 163 lb 6.4 oz (74.1 kg)   SpO2 96%   BMI 28.05 kg/m     Wt Readings from Last 3 Encounters:  01/24/19 163 lb 6.4 oz (74.1 kg)  12/20/18 175 lb (79.4 kg)  12/13/18 169 lb (76.7 kg)     GEN:  Well nourished, well developed in no acute distress HEENT: Normal NECK: No JVD; No carotid bruits LYMPHATICS: No lymphadenopathy CARDIAC: RRR, no murmurs, no rubs, no gallops RESPIRATORY:  Clear to auscultation without rales, wheezing or rhonchi  ABDOMEN: Soft, non-tender, non-distended MUSCULOSKELETAL:  No edema; No deformity  SKIN: Warm and dry LOWER EXTREMITIES: no swelling NEUROLOGIC:  Alert and oriented x 3 PSYCHIATRIC:  Normal affect   ASSESSMENT:    1. Dyslipidemia (high LDL; low HDL)   2. Coronary artery disease involving native coronary artery of native heart with other form of  angina pectoris ([place])   3. Essential hypertension    PLAN:    In order of problems listed above:  1. Coronary artery disease doing well from that point review cardiac catheterization analyzed with the patient.  She is on medical therapy which I will continue. 2. Dyslipidemia.  I will ask her to have fasting lipid profile done today.  This is in order to initiate therapy with PCSK9 agent.  Apparently that being initiated the [place] supposed to take care of this but nothing happened.  Hopefully  now with her weight loss her cholesterol is better. 3. Essential hypertension blood pressure well controlled continue present management. 4. Intentional significant weight loss I congratulated him for it I encouraged her to exercise on a regular basis continue antianginal therapy let me know if she developed some chest pain.   Medication Adjustments/Labs and Tests Ordered: Current medicines are reviewed at length with the patient today.  Concerns regarding medicines are outlined above.  Orders Placed This Encounter  Procedures  . Lipid panel   Medication changes: No orders of the defined types were placed in this encounter.   Signed, Park Liter, MD, [facility] 01/24/2019 2:53 PM    [place]

## 2019-01-25 LAB — LIPID PANEL
Chol/HDL Ratio: 6.2 ratio — ABNORMAL HIGH (ref 0.0–4.4)
Cholesterol, Total: 280 mg/dL — ABNORMAL HIGH (ref 100–199)
HDL: 45 mg/dL
LDL Chol Calc (NIH): 196 mg/dL — ABNORMAL HIGH (ref 0–99)
Triglycerides: 206 mg/dL — ABNORMAL HIGH (ref 0–149)
VLDL Cholesterol Cal: 39 mg/dL (ref 5–40)

## 2019-01-26 ENCOUNTER — Telehealth: Payer: Self-pay | Admitting: Emergency Medicine

## 2019-01-26 NOTE — Telephone Encounter (Signed)
Patient informed of results and informed that there has been a RX for repatha sent to [place] by Dr. Debara Pickett on 12/12/2018 (she was already referred to lipid clinic) . [facility]. Patient verbally understands, no further questions. She will call us if there is a issue with the RX.

## 2019-03-06 ENCOUNTER — Telehealth: Payer: Self-pay | Admitting: Internal Medicine

## 2019-03-06 NOTE — Telephone Encounter (Signed)
Attempted to call patient to discuss PCSK9 - per previous notes/labs, she has not started. VM was full and cannot take messages

## 2019-03-19 NOTE — Telephone Encounter (Signed)
Attempted to call patient to discuss PCSK9. VM is fill, cannot accept messages.

## 2019-03-20 ENCOUNTER — Telehealth: Payer: Self-pay | Admitting: Cardiology

## 2019-03-20 MED ORDER — RANOLAZINE ER 1000 MG PO TB12: 1000.0000 mg | ORAL_TABLET | Freq: Two times a day (BID) | ORAL | 0 refills | Status: DC

## 2019-03-20 NOTE — Addendum Note (Signed)
Addended by: Ashok Norris on: 03/20/2019 02:42 PM   Modules accepted: Orders

## 2019-03-20 NOTE — Telephone Encounter (Signed)
Ranexa 100 mg twice daily refilled.

## 2019-03-20 NOTE — Telephone Encounter (Signed)
Call Ranexa to the [facility]

## 2019-05-01 ENCOUNTER — Ambulatory Visit (INDEPENDENT_AMBULATORY_CARE_PROVIDER_SITE_OTHER): Payer: No Typology Code available for payment source | Admitting: Cardiology

## 2019-05-01 ENCOUNTER — Encounter: Payer: Self-pay | Admitting: Cardiology

## 2019-05-01 ENCOUNTER — Other Ambulatory Visit: Payer: Self-pay

## 2019-05-01 VITALS — BP 120/70 | HR 84 | Ht 64.0 in | Wt 156.0 lb

## 2019-05-01 DIAGNOSIS — E785 Hyperlipidemia, unspecified: Secondary | ICD-10-CM

## 2019-05-01 DIAGNOSIS — I25118 Atherosclerotic heart disease of native coronary artery with other forms of angina pectoris: Secondary | ICD-10-CM

## 2019-05-01 DIAGNOSIS — I10 Essential (primary) hypertension: Secondary | ICD-10-CM | POA: Diagnosis not present

## 2019-05-01 MED ORDER — REPATHA SURECLICK 140 MG/ML ~~LOC~~ SOAJ: 1.0000 | SUBCUTANEOUS | 5 refills | Status: DC

## 2019-05-01 MED ORDER — ISOSORBIDE MONONITRATE ER 120 MG PO TB24: 120.0000 mg | ORAL_TABLET | Freq: Every day | ORAL | 1 refills | Status: DC

## 2019-05-01 NOTE — Patient Instructions (Signed)
Medication Instructions:  Your physician has recommended you make the following change in your medication:   INCREASE: Imdur to 120 mg daily   *If you need a refill on your cardiac medications before your next appointment, please call your pharmacy*  Lab Work: Your physician recommends that you return for lab work today: troponin, lipids   If you have labs (blood work) drawn today and your tests are completely normal, you will receive your results only by: [name] MyChart Message (if you have MyChart) OR . A paper copy in the mail If you have any lab test that is abnormal or we need to change your treatment, we will call you to review the results.  Testing/Procedures: None.   Follow-Up: At [facility], you and your health needs are our priority.  As part of our continuing mission to provide you with exceptional heart care, we have created designated Provider Care Teams.  These Care Teams include your primary Cardiologist (physician) and Advanced Practice Providers (APPs -  Physician Assistants and Nurse Practitioners) who all work together to provide you with the care you need, when you need it.  Your next appointment:   1 month(s)  The format for your next appointment:   In Person  Provider:   Jenne Campus, MD  Other Instructions

## 2019-05-01 NOTE — Progress Notes (Signed)
Cardiology Office Note:    Date:  05/01/2019   ID:  Penny Boyer, DOB Mar 15, 1956, MRN [id number]  PCP:  [place]  Cardiologist:  Jenne Campus, MD    Referring MD: [place]   Chief Complaint  Patient presents with  . Follow-up  Doing well but had some issues during the night with some atypical pain  History of Present Illness:    Penny Boyer is a 63 y.o. female with past medical history significant for coronary artery disease.  She had a stress test done which showed ischemia in LAD territory cardiac catheterization after that showed diagonal branch disease and decision has been made to pursue medical therapy.  She been doing quite well however she described the fact that she did have last 2 nights some heaviness in the chest.  She took nitroglycerin Prevacid as well as water and that relieved the pain.  During the day she was able to do whatever she wants to do with no difficulties.  Overall she is doing quite well she lost about 60 pounds total she is very proud of herself and she should be she continue keto diet as well as intermittent fasting.  Sadly she still not on the [place].  There was some issue with her insurance and [facility] that still failing to [facility] this medication to her will work on that trying to make sure she would get it.  Past Medical History:  Diagnosis Date  . Coronary artery disease involving native coronary artery has post PTCA and stenting of the circumflex artery in March 2017 of native heart 07/28/2016   Overview:  PTCA and stent of LCX artery in March 2017 in [place]  . Dyslipidemia (high LDL; low HDL) 07/28/2016  . Hyperlipidemia   . Hypertension   . Precordial pain 07/28/2016      Current Medications: Current Meds  Medication Sig  . albuterol (VENTOLIN HFA) 108 (90 Base) MCG/ACT inhaler Inhale 2 puffs into the lungs every 6 (six) hours as needed for wheezing or shortness of breath.  [name] aspirin (GOODSENSE ASPIRIN) 325 MG  tablet Take 325 mg by mouth at bedtime.   . famotidine-calcium carbonate-magnesium hydroxide (PEPCID COMPLETE) 10-800-165 MG chewable tablet Chew 1 tablet by mouth daily as needed (acid reflux).  [name] ibuprofen (ADVIL) 200 MG tablet Take 800 mg by mouth every 6 (six) hours as needed for headache or moderate pain.  . isosorbide mononitrate (IMDUR) 30 MG 24 hr tablet Take 60 mg by mouth daily.   [name] losartan (COZAAR) 100 MG tablet Take 100 mg by mouth daily.  . nitroGLYCERIN (NITROSTAT) 0.4 MG SL tablet Place 1 tablet (0.4 mg total) under the tongue as needed for chest pain.  [name] propranolol ER (INDERAL LA) 120 MG 24 hr capsule Take 120 mg by mouth daily.  . ranolazine (RANEXA) 1000 MG SR tablet Take 1 tablet (1,000 mg total) by mouth 2 (two) times daily.     Allergies:   Atorvastatin and Hydrochlorothiazide w-triamterene   Social History   Socioeconomic History  . Marital status: Married    Spouse name: Not on file  . Number of children: Not on file  . Years of education: Not on file  . Highest education level: Not on file  Occupational History  . Not on file  Tobacco Use  . Smoking status: Former Research scientist (life sciences)  . Smokeless tobacco: Never Used  Substance and Sexual Activity  . Alcohol use: Yes  . Drug use: No  . Sexual  activity: Not on file  Other Topics Concern  . Not on file  Social History Narrative  . Not on file   Social Determinants of Health   Financial Resource Strain:   . Difficulty of Paying Living Expenses: Not on file  Food Insecurity:   . Worried About Charity fundraiser in the Last Year: Not on file  . Ran Out of Food in the Last Year: Not on file  Transportation Needs:   . Lack of Transportation (Medical): Not on file  . Lack of Transportation (Non-Medical): Not on file  Physical Activity:   . Days of Exercise per Week: Not on file  . Minutes of Exercise per Session: Not on file  Stress:   . Feeling of Stress : Not on file  Social Connections:   . Frequency of  Communication with Friends and Family: Not on file  . Frequency of Social Gatherings with Friends and Family: Not on file  . Attends Religious Services: Not on file  . Active Member of Clubs or Organizations: Not on file  . Attends Archivist Meetings: Not on file  . Marital Status: Not on file     Family History: The patient's family history includes Cancer in her mother; Heart disease in her brother, brother, and father. ROS:   Please see the history of present illness.    All 14 point review of systems negative except as described per history of present illness  EKGs/Labs/Other Studies Reviewed:      Recent Labs: 12/13/2018: BUN 18; Creatinine, Ser 0.82; Hemoglobin 13.4; Platelets 254; Potassium 3.7; Sodium 142  Recent Lipid Panel    Component Value Date/Time   CHOL 280 (H) 01/24/2019 1352   TRIG 206 (H) 01/24/2019 1352   HDL 45 01/24/2019 1352   CHOLHDL 6.2 (H) 01/24/2019 1352   LDLCALC 196 (H) 01/24/2019 1352    Physical Exam:    VS:  BP 120/70   Pulse 84   Ht 5\' 4"  (1.626 m)   Wt 156 lb (70.8 kg)   SpO2 99%   BMI 26.78 kg/m     Wt Readings from Last 3 Encounters:  05/01/19 156 lb (70.8 kg)  01/24/19 163 lb 6.4 oz (74.1 kg)  12/20/18 175 lb (79.4 kg)     GEN:  Well nourished, well developed in no acute distress HEENT: Normal NECK: No JVD; No carotid bruits LYMPHATICS: No lymphadenopathy CARDIAC: RRR, no murmurs, no rubs, no gallops RESPIRATORY:  Clear to auscultation without rales, wheezing or rhonchi  ABDOMEN: Soft, non-tender, non-distended MUSCULOSKELETAL:  No edema; No deformity  SKIN: Warm and dry LOWER EXTREMITIES: no swelling NEUROLOGIC:  Alert and oriented x 3 PSYCHIATRIC:  Normal affect   ASSESSMENT:    1. 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 Essential hypertension   3. Dyslipidemia (high LDL; low HDL)    PLAN:    In order of problems listed  above:  1. Coronary disease doing well from that point of view but described to have 2 nights with some atypical symptoms.  EKG troponin I will be done today.  I increase the dose of Imdur from 60 to 120 mg.  I see her back in 1 month to see how she does. 2. Essential hypertension blood pressure appears to be well controlled 120/70 today.  We will continue present management. 3. Dyslipidemia frustrating situation she still not taking PCSK9 agent.  Will try to  figure out what is the problem hopefully will be able to fix it so she can get her appropriate medications.  In the meantime I will check her fasting lipid profile would like to know how she is doing with her diet which is probably not the most is her brother died from somebody with heart issues.   Medication Adjustments/Labs and Tests Ordered: Current medicines are reviewed at length with the patient today.  Concerns regarding medicines are outlined above.  No orders of the defined types were placed in this encounter.  Medication changes: No orders of the defined types were placed in this encounter.   Signed, Park Liter, MD, [facility] 05/01/2019 11:08 AM    [place]

## 2019-05-02 LAB — LIPID PANEL
Chol/HDL Ratio: 4.2 ratio (ref 0.0–4.4)
Cholesterol, Total: 267 mg/dL — ABNORMAL HIGH (ref 100–199)
HDL: 64 mg/dL (ref >39)
LDL Chol Calc (NIH): 182 mg/dL — ABNORMAL HIGH (ref 0–99)
Triglycerides: 117 mg/dL (ref 0–149)
VLDL Cholesterol Cal: 21 mg/dL (ref 5–40)

## 2019-05-02 LAB — TROPONIN I: Troponin I: <0.01 ng/mL (ref 0.00–0.04)

## 2019-06-01 ENCOUNTER — Ambulatory Visit: Payer: No Typology Code available for payment source | Admitting: Cardiology

## 2019-06-04 ENCOUNTER — Ambulatory Visit (INDEPENDENT_AMBULATORY_CARE_PROVIDER_SITE_OTHER): Payer: No Typology Code available for payment source | Admitting: Cardiology

## 2019-06-04 ENCOUNTER — Encounter: Payer: Self-pay | Admitting: Cardiology

## 2019-06-04 ENCOUNTER — Other Ambulatory Visit: Payer: Self-pay

## 2019-06-04 VITALS — BP 118/84 | HR 81 | Ht 64.0 in | Wt 153.0 lb

## 2019-06-04 DIAGNOSIS — I25118 Atherosclerotic heart disease of native coronary artery with other forms of angina pectoris: Secondary | ICD-10-CM

## 2019-06-04 DIAGNOSIS — E785 Hyperlipidemia, unspecified: Secondary | ICD-10-CM

## 2019-06-04 DIAGNOSIS — I10 Essential (primary) hypertension: Secondary | ICD-10-CM | POA: Diagnosis not present

## 2019-06-04 DIAGNOSIS — R9439 Abnormal result of other cardiovascular function study: Secondary | ICD-10-CM | POA: Diagnosis not present

## 2019-06-04 MED ORDER — LOSARTAN POTASSIUM 100 MG PO TABS: 100.0000 mg | ORAL_TABLET | Freq: Every day | ORAL | 3 refills | Status: DC

## 2019-06-04 NOTE — Progress Notes (Signed)
Cardiology Office Note:    Date:  06/04/2019   ID:  Penny Boyer, DOB 1956-04-29, MRN [id number]  PCP:  [facility]  Cardiologist:  Jenne Campus, MD    Referring MD: [place]   Chief Complaint  Patient presents with  . Coronary Artery Disease    History of Present Illness:    Penny Boyer is a 64 y.o. female  with past medical history significant for coronary artery disease.  She did have a cardiac catheterization in 2017 with PTCA and stenting of the circumflex artery done in [place].  After that She had a stress test done which showed ischemia in LAD territory cardiac catheterization after that showed diagonal branch disease and decision has been made to pursue medical therapy.  Overall she is doing great.  She is asymptomatic.  Denies have any chest pain tightness squeezing pressure burning chest.  She lost some more weight and she is very proud of herself.  Finally got approved for PCSK9 agent and she is taking Praluent.  She took first dose yesterday.  Past Medical History:  Diagnosis Date  . Coronary artery disease involving native coronary artery has post PTCA and stenting of the circumflex artery in March 2017 of native heart 07/28/2016   Overview:  PTCA and stent of LCX artery in March 2017 in [place]  . Dyslipidemia (high LDL; low HDL) 07/28/2016  . Hyperlipidemia   . Hypertension   . Precordial pain 07/28/2016      Current Medications: Current Meds  Medication Sig  . albuterol (VENTOLIN HFA) 108 (90 Base) MCG/ACT inhaler Inhale 2 puffs into the lungs every 6 (six) hours as needed for wheezing or shortness of breath.  . Alirocumab (PRALUENT) 75 MG/ML SOAJ Inject into the skin every 14 (fourteen) days.  [name] aspirin (GOODSENSE ASPIRIN) 325 MG tablet Take 325 mg by mouth at bedtime.   . famotidine-calcium carbonate-magnesium hydroxide (PEPCID COMPLETE) 10-800-165 MG chewable tablet Chew 1 tablet by mouth daily as needed (acid reflux).  [name]  ibuprofen (ADVIL) 200 MG tablet Take 800 mg by mouth every 6 (six) hours as needed for headache or moderate pain.  . isosorbide mononitrate (IMDUR) 120 MG 24 hr tablet Take 1 tablet (120 mg total) by mouth daily.  [name] losartan (COZAAR) 100 MG tablet Take 100 mg by mouth daily.  . nitroGLYCERIN (NITROSTAT) 0.4 MG SL tablet Place 1 tablet (0.4 mg total) under the tongue as needed for chest pain.  [name] propranolol ER (INDERAL LA) 120 MG 24 hr capsule Take 120 mg by mouth daily.  . ranolazine (RANEXA) 1000 MG SR tablet Take 1 tablet (1,000 mg total) by mouth 2 (two) times daily.  . [DISCONTINUED] Evolocumab (REPATHA SURECLICK) [id number] MG/ML SOAJ Inject 1 Dose into the skin every 14 (fourteen) days.     Allergies:   Atorvastatin and Hydrochlorothiazide w-triamterene   Social History   Socioeconomic History  . Marital status: Married    Spouse name: Not on file  . Number of children: Not on file  . Years of education: Not on file  . Highest education level: Not on file  Occupational History  . Not on file  Tobacco Use  . Smoking status: Former Research scientist (life sciences)  . Smokeless tobacco: Never Used  Substance and Sexual Activity  . Alcohol use: Yes  . Drug use: No  . Sexual activity: Not on file  Other Topics Concern  . Not on file  Social History Narrative  . Not on file  Social Determinants of Health   Financial Resource Strain:   . Difficulty of Paying Living Expenses: Not on file  Food Insecurity:   . Worried About Charity fundraiser in the Last Year: Not on file  . Ran Out of Food in the Last Year: Not on file  Transportation Needs:   . Lack of Transportation (Medical): Not on file  . Lack of Transportation (Non-Medical): Not on file  Physical Activity:   . Days of Exercise per Week: Not on file  . Minutes of Exercise per Session: Not on file  Stress:   . Feeling of Stress : Not on file  Social Connections:   . Frequency of Communication with Friends and Family: Not on file  . Frequency of  Social Gatherings with Friends and Family: Not on file  . Attends Religious Services: Not on file  . Active Member of Clubs or Organizations: Not on file  . Attends Archivist Meetings: Not on file  . Marital Status: Not on file     Family History: The patient's family history includes Cancer in her mother; Heart disease in her brother, brother, and father. ROS:   Please see the history of present illness.    All 14 point review of systems negative except as described per history of present illness  EKGs/Labs/Other Studies Reviewed:      Recent Labs: 12/13/2018: BUN 18; Creatinine, Ser 0.82; Hemoglobin 13.4; Platelets 254; Potassium 3.7; Sodium 142  Recent Lipid Panel    Component Value Date/Time   CHOL 267 (H) 05/01/2019 1124   TRIG 117 05/01/2019 1124   HDL 64 05/01/2019 1124   CHOLHDL 4.2 05/01/2019 1124   LDLCALC 182 (H) 05/01/2019 1124    Physical Exam:    VS:  BP 118/84 (BP Location: Left Arm, Patient Position: Sitting, Cuff Size: Normal)   Pulse 81   Ht 5\' 4"  (1.626 m)   Wt 153 lb (69.4 kg)   SpO2 98%   BMI 26.26 kg/m     Wt Readings from Last 3 Encounters:  06/04/19 153 lb (69.4 kg)  05/01/19 156 lb (70.8 kg)  01/24/19 163 lb 6.4 oz (74.1 kg)     GEN:  Well nourished, well developed in no acute distress HEENT: Normal NECK: No JVD; No carotid bruits LYMPHATICS: No lymphadenopathy CARDIAC: RRR, no murmurs, no rubs, no gallops RESPIRATORY:  Clear to auscultation without rales, wheezing or rhonchi  ABDOMEN: Soft, non-tender, non-distended MUSCULOSKELETAL:  No edema; No deformity  SKIN: Warm and dry LOWER EXTREMITIES: no swelling NEUROLOGIC:  Alert and oriented x 3 PSYCHIATRIC:  Normal affect   ASSESSMENT:    1. 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 Essential hypertension   3. Dyslipidemia (high LDL; low HDL)   4. Abnormal nuclear stress test    PLAN:    In  order of problems listed above:  1. Coronary disease stable from that point review.  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 Essential hypertension blood pressure well controlled continue present management. 3. Dyslipidemia she is on Praluent continue 4. Abnormal stress test with diagonal branch disease.  Doing well from that point review.   Medication Adjustments/Labs and Tests Ordered: Current medicines are reviewed at length with the patient today.  Concerns regarding medicines are outlined above.  No orders of the defined types were placed in this encounter.  Medication changes: No  orders of the defined types were placed in this encounter.   Signed, Park Liter, MD, [facility] 06/04/2019 11:30 AM    [place]

## 2019-06-04 NOTE — Patient Instructions (Signed)

## 2019-06-14 ENCOUNTER — Telehealth: Payer: Self-pay | Admitting: Cardiology

## 2019-06-14 NOTE — Telephone Encounter (Signed)
New Message      *STAT* If patient is at the pharmacy, call can be transferred to refill team.   1. Which medications need to be refilled? (please list name of each medication and dose if known) nitroGLYCERIN (NITROSTAT) 0.4 MG SL tablet  2. Which pharmacy/location (including street and city if local pharmacy) is medication to be sent to? [facility], [place].  3. Do they need a 30 day or 90 day supply? 30 or 90   Pt is out of medication

## 2019-06-15 ENCOUNTER — Other Ambulatory Visit: Payer: Self-pay

## 2019-06-15 MED ORDER — NITROGLYCERIN 0.4 MG SL SUBL: 0.4000 mg | SUBLINGUAL_TABLET | SUBLINGUAL | 5 refills | Status: DC | PRN

## 2019-06-15 NOTE — Telephone Encounter (Signed)
Refilled rx and sent to pharmacy.

## 2019-06-15 NOTE — Progress Notes (Signed)
Refilled Nitroglycerin 

## 2019-06-26 ENCOUNTER — Telehealth: Payer: Self-pay | Admitting: Cardiology

## 2019-06-26 NOTE — Telephone Encounter (Signed)
Called patient back. No answer and voicemail was full.

## 2019-06-26 NOTE — Telephone Encounter (Signed)
Yes, she needs to go to ER

## 2019-06-26 NOTE — Telephone Encounter (Signed)
Pt c/o of Chest Pain: STAT if CP now or developed within 24 hours  1. Are you having CP right now? No   2. Are you experiencing any other symptoms (ex. SOB, nausea, vomiting, sweating)? SOB & fatigue  3. How long have you been experiencing CP? Past few days   4. Is your CP continuous or coming and going? Coming and going   5. Have you taken Nitroglycerin? Yes  ? Patient is calling stating she has been experiencing CP. She states she feels the same way she felt before she got her first stent put in. She is constantly tired and SOB occurs when she moves around. The patient has taken nitroglycerin in regards to it. She states this is just not her and something isn't right. Please advise.

## 2019-06-26 NOTE — Telephone Encounter (Signed)
Called patient. She reports for the past few days she has had some intermittent chest pain on her left side of her chest that radiates to back and arms. She also reports feeling short of breath and fatigued. She cannot even walk 200 ft without feel exhausted and this is not like her. She said when she takes nitroglycerin it does relieve her pain. She denies any pain right now. I advised her this is concerning and if pain returns she needs to go to the emergency room. She doesn't want to go and wanted me to check with Dr. Agustin Cree first.

## 2019-06-26 NOTE — Telephone Encounter (Signed)
Called patient informed her she needs to go to there ER. She verbally understood.

## 2019-07-06 ENCOUNTER — Other Ambulatory Visit: Payer: Self-pay | Admitting: *Deleted

## 2019-07-06 ENCOUNTER — Telehealth: Payer: Self-pay | Admitting: Cardiology

## 2019-07-06 MED ORDER — RANOLAZINE ER 1000 MG PO TB12: 1000.0000 mg | ORAL_TABLET | Freq: Two times a day (BID) | ORAL | 1 refills | Status: DC

## 2019-07-06 NOTE — Telephone Encounter (Signed)
Refill sent.

## 2019-07-06 NOTE — Telephone Encounter (Signed)
New message   *STAT* If patient is at the pharmacy, call can be transferred to refill team.   1. Which medications need to be refilled? (please list name of each medication and dose if known) ranolazine (RANEXA) 1000 MG SR tablet  2. Which pharmacy/location (including street and city if local pharmacy) is medication to be sent to? [place], [place].  3. Do they need a 30 day or 90 day supply? 90 day

## 2019-09-05 DIAGNOSIS — I472 Ventricular tachycardia: Secondary | ICD-10-CM

## 2019-09-05 DIAGNOSIS — R55 Syncope and collapse: Secondary | ICD-10-CM | POA: Diagnosis not present

## 2019-09-06 DIAGNOSIS — I2510 Atherosclerotic heart disease of native coronary artery without angina pectoris: Secondary | ICD-10-CM

## 2019-09-06 DIAGNOSIS — R55 Syncope and collapse: Secondary | ICD-10-CM

## 2019-09-06 DIAGNOSIS — E785 Hyperlipidemia, unspecified: Secondary | ICD-10-CM

## 2019-09-06 DIAGNOSIS — R9431 Abnormal electrocardiogram [ECG] [EKG]: Secondary | ICD-10-CM

## 2019-09-06 DIAGNOSIS — I10 Essential (primary) hypertension: Secondary | ICD-10-CM

## 2019-09-07 DIAGNOSIS — E785 Hyperlipidemia, unspecified: Secondary | ICD-10-CM

## 2019-09-07 DIAGNOSIS — I2510 Atherosclerotic heart disease of native coronary artery without angina pectoris: Secondary | ICD-10-CM

## 2019-09-07 DIAGNOSIS — R55 Syncope and collapse: Secondary | ICD-10-CM

## 2019-09-07 DIAGNOSIS — I10 Essential (primary) hypertension: Secondary | ICD-10-CM

## 2019-09-27 ENCOUNTER — Telehealth: Payer: Self-pay | Admitting: Cardiology

## 2019-09-27 NOTE — Telephone Encounter (Signed)
Patient calling stating she wore a heart monitor and [facility] received [facility]. She states they require our office to request the report. Patient states to call Manuela Schwartz [phone], her community care coordinator.

## 2019-09-28 NOTE — Telephone Encounter (Signed)
Left message for patient and Penny Boyer to return call.

## 2019-10-01 NOTE — Telephone Encounter (Signed)
Penny Boyer called back. She is refaxing monitor report. Confirmed she had the correct number.

## 2019-10-17 DIAGNOSIS — N200 Calculus of kidney: Secondary | ICD-10-CM

## 2019-10-17 HISTORY — DX: Calculus of kidney: N20.0

## 2019-10-18 ENCOUNTER — Other Ambulatory Visit: Payer: Self-pay

## 2019-10-18 ENCOUNTER — Ambulatory Visit (INDEPENDENT_AMBULATORY_CARE_PROVIDER_SITE_OTHER): Payer: No Typology Code available for payment source | Admitting: Cardiology

## 2019-10-18 ENCOUNTER — Encounter: Payer: Self-pay | Admitting: Cardiology

## 2019-10-18 VITALS — BP 122/92 | HR 62 | Ht 64.0 in | Wt 148.0 lb

## 2019-10-18 DIAGNOSIS — R55 Syncope and collapse: Secondary | ICD-10-CM

## 2019-10-18 DIAGNOSIS — I10 Essential (primary) hypertension: Secondary | ICD-10-CM

## 2019-10-18 DIAGNOSIS — I25118 Atherosclerotic heart disease of native coronary artery with other forms of angina pectoris: Secondary | ICD-10-CM | POA: Diagnosis not present

## 2019-10-18 DIAGNOSIS — E785 Hyperlipidemia, unspecified: Secondary | ICD-10-CM

## 2019-10-18 MED ORDER — ISOSORBIDE MONONITRATE ER 60 MG PO TB24: 60.0000 mg | ORAL_TABLET | Freq: Every day | ORAL | 0 refills | Status: DC

## 2019-10-18 MED ORDER — PROPRANOLOL HCL ER 60 MG PO CP24: 60.0000 mg | ORAL_CAPSULE | Freq: Every day | ORAL | 0 refills | Status: DC

## 2019-10-18 NOTE — Progress Notes (Signed)
Cardiology Office Note:    Date:  10/18/2019   ID:  Penny Boyer, DOB 04/18/1956, MRN [id number]  PCP:  [place]  Cardiologist:  Jenne Campus, MD    Referring MD: [place]   Chief Complaint  Patient presents with  . Follow-up  I am still have episode of syncope  History of Present Illness:    Penny Boyer is a 64 y.o. female   with past medical history significant for coronary artery disease.  She did have a cardiac catheterization in 2017 with PTCA and stenting of the circumflex artery done in [place].  After thatShe had a stress test done which showed ischemia in LAD territory cardiac catheterization after that showed diagonal branch disease and decision has been made to pursue medical therapy.  Recently she was admitted to the hospital because of episode of syncope.  She told me that she got between 10-12 types of syncope interestingly when she was in the hospital with episode of syncope she was wearing Zio patch.  From the description she gave me while in the hospital I was suspecting she got vasovagal syncope however she was sent back to [place] because apparently monitor showed some significant pauses.  I do not have that monitoring in the hands.  She described 1 episode of syncope that she had since the time she came out of the hospital she said she was walking started feeling weak sweaty and eventually end up laying down in the bed and then she passed out.  In the meantime her primary care physician try to switch her from Inderal 120 daily to propranolol 40 mg twice daily when she took that medication she had significant drop in her blood pressure.  Past Medical History:  Diagnosis Date  . Coronary artery disease involving native coronary artery has post PTCA and stenting of the circumflex artery in March 2017 of native heart 07/28/2016   Overview:  PTCA and stent of LCX artery in March 2017 in [place]  . Dyslipidemia (high LDL; low HDL) 07/28/2016  .  Hyperlipidemia   . Hypertension   . Precordial pain 07/28/2016    Past Surgical History:  Procedure Laterality Date  . ABDOMINAL HYSTERECTOMY    . APPENDECTOMY    . CARDIAC CATHETERIZATION    . CHOLECYSTECTOMY    . FOOT SURGERY    . INTRAVASCULAR PRESSURE WIRE/FFR STUDY N/A 12/20/2018   Procedure: INTRAVASCULAR PRESSURE WIRE/FFR STUDY;  Surgeon: Leonie Man, MD;  Location: [facility];  Service: Cardiovascular;  Laterality: N/A;  . LEFT HEART CATH AND CORONARY ANGIOGRAPHY N/A 12/20/2018   Procedure: LEFT HEART CATH AND CORONARY ANGIOGRAPHY;  Surgeon: Leonie Man, MD;  Location: [facility];  Service: Cardiovascular;  Laterality: N/A;  . MANDIBLE FRACTURE SURGERY    . Ulnar Surgery    . WRIST FRACTURE SURGERY      Current Medications: Current Meds  Medication Sig  . albuterol (VENTOLIN HFA) 108 (90 Base) MCG/ACT inhaler Inhale 2 puffs into the lungs every 6 (six) hours as needed for wheezing or shortness of breath.  . Alirocumab (PRALUENT) 75 MG/ML SOAJ Inject into the skin every 14 (fourteen) days.  [name] aspirin (GOODSENSE ASPIRIN) 325 MG tablet Take 325 mg by mouth at bedtime.   . famotidine-calcium carbonate-magnesium hydroxide (PEPCID COMPLETE) 10-800-165 MG chewable tablet Chew 1 tablet by mouth daily as needed (acid reflux).  [name] ibuprofen (ADVIL) 200 MG tablet Take 800 mg by mouth every 6 (six) hours  as needed for headache or moderate pain.  . isosorbide mononitrate (IMDUR) 120 MG 24 hr tablet Take 1 tablet (120 mg total) by mouth daily.  [name] losartan (COZAAR) 100 MG tablet Take 1 tablet (100 mg total) by mouth daily.  . nitroGLYCERIN (NITROSTAT) 0.4 MG SL tablet Place 1 tablet (0.4 mg total) under the tongue as needed for chest pain.  [name] propranolol ER (INDERAL LA) 120 MG 24 hr capsule Take 120 mg by mouth daily.  . ranolazine (RANEXA) 1000 MG SR tablet Take 1 tablet (1,000 mg total) by mouth 2 (two) times daily.     Allergies:   Atorvastatin and  Hydrochlorothiazide w-triamterene   Social History   Socioeconomic History  . Marital status: Married    Spouse name: Not on file  . Number of children: Not on file  . Years of education: Not on file  . Highest education level: Not on file  Occupational History  . Not on file  Tobacco Use  . Smoking status: Former Research scientist (life sciences)  . Smokeless tobacco: Never Used  Substance and Sexual Activity  . Alcohol use: Yes  . Drug use: No  . Sexual activity: Not on file  Other Topics Concern  . Not on file  Social History Narrative  . Not on file   Social Determinants of Health   Financial Resource Strain:   . Difficulty of Paying Living Expenses:   Food Insecurity:   . Worried About Charity fundraiser in the Last Year:   . Arboriculturist in the Last Year:   Transportation Needs:   . Film/video editor (Medical):   [name] Lack of Transportation (Non-Medical):   Physical Activity:   . Days of Exercise per Week:   . Minutes of Exercise per Session:   Stress:   . Feeling of Stress :   Social Connections:   . Frequency of Communication with Friends and Family:   . Frequency of Social Gatherings with Friends and Family:   . Attends Religious Services:   . Active Member of Clubs or Organizations:   . Attends Archivist Meetings:   [name] Marital Status:      Family History: The patient's family history includes Cancer in her mother; Heart disease in her brother, brother, and father. ROS:   Please see the history of present illness.    All 14 point review of systems negative except as described per history of present illness  EKGs/Labs/Other Studies Reviewed:      Recent Labs: 12/13/2018: BUN 18; Creatinine, Ser 0.82; Hemoglobin 13.4; Platelets 254; Potassium 3.7; Sodium 142  Recent Lipid Panel    Component Value Date/Time   CHOL 267 (H) 05/01/2019 1124   TRIG 117 05/01/2019 1124   HDL 64 05/01/2019 1124   CHOLHDL 4.2 05/01/2019 1124   LDLCALC 182 (H) 05/01/2019 1124     Physical Exam:    VS:  BP (!) 122/92   Pulse 62   Ht 5\' 4"  (1.626 m)   Wt 148 lb (67.1 kg)   SpO2 96%   BMI 25.40 kg/m     Wt Readings from Last 3 Encounters:  10/18/19 148 lb (67.1 kg)  06/04/19 153 lb (69.4 kg)  05/01/19 156 lb (70.8 kg)     GEN:  Well nourished, well developed in no acute distress HEENT: Normal NECK: No JVD; No carotid bruits LYMPHATICS: No lymphadenopathy CARDIAC: RRR, no murmurs, no rubs, no gallops RESPIRATORY:  Clear to auscultation without rales, wheezing or rhonchi  ABDOMEN: Soft, non-tender, non-distended MUSCULOSKELETAL:  No edema; No deformity  SKIN: Warm and dry LOWER EXTREMITIES: no swelling NEUROLOGIC:  Alert and oriented x 3 PSYCHIATRIC:  Normal affect   ASSESSMENT:    1. 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 Syncope and collapse   3. Essential hypertension   4. Dyslipidemia (high LDL; low HDL)    PLAN:    In order of problems listed above:  1. Coronary disease stable from that point review she does have diagonal branch disease which we know about.  Medical management. 2. Syncope and collapse obviously very concerning.  I will request that again to get the Zio patch report to see exactly what kind of pauses we talking about.  In the meantime I will cut down her Inderal from 120 that she takes right now to 60 for only 3 days and then completely discontinue it.  After that we will probably again end up putting another monitor on her. 3. Essential hypertension blood pressure well controlled continue present management. 4. Dyslipidemia she is on Praluent.  I will continue present management.  I did review her record from the hospital.  We requested Zio patch report from prior primary care physician   Medication Adjustments/Labs and Tests Ordered: Current medicines are reviewed at length with the patient today.  Concerns regarding medicines are outlined  above.  No orders of the defined types were placed in this encounter.  Medication changes:  Meds ordered this encounter  Medications  . DISCONTD: isosorbide mononitrate (IMDUR) 60 MG 24 hr tablet    Sig: Take 1 tablet (60 mg total) by mouth daily.    Dispense:  5 tablet    Refill:  0    Signed, Park Liter, MD, [facility] 10/18/2019 12:18 PM    [place]

## 2019-10-18 NOTE — Telephone Encounter (Signed)
Left message asking susan to please resend monitor.

## 2019-10-18 NOTE — Progress Notes (Signed)
Spoke to pharmacy informed [facility] that the correct medication that was supposed to be sent in was propranolol 60 mg for only 3 days NOT isosorbide mononitrate 60 mg. Patient aware as well.

## 2019-10-18 NOTE — Patient Instructions (Addendum)
Medication Instructions:  Your physician has recommended you make the following change in your medication:   STOP: inderal 120 mg  TAKE : inderal 60 mg for 3 days then stop    *If you need a refill on your cardiac medications before your next appointment, please call your pharmacy*   Lab Work: None.  If you have labs (blood work) drawn today and your tests are completely normal, you will receive your results only by:  [place] (if you have MyChart) OR  A paper copy in the mail If you have any lab test that is abnormal or we need to change your treatment, we will call you to review the results.   Testing/Procedures: None.   Follow-Up: At [facility], you and your health needs are our priority.  As part of our continuing mission to provide you with exceptional heart care, we have created designated Provider Care Teams.  These Care Teams include your primary Cardiologist (physician) and Advanced Practice Providers (APPs -  Physician Assistants and Nurse Practitioners) who all work together to provide you with the care you need, when you need it.  We recommend signing up for the patient portal called "MyChart".  Sign up information is provided on this After Visit Summary.  MyChart is used to connect with patients for Virtual Visits (Telemedicine).  Patients are able to view lab/test results, encounter notes, upcoming appointments, etc.  Non-urgent messages can be sent to your provider as well.   To learn more about what you can do with MyChart, go to [email or web address].    Your next appointment:   1 month(s)  The format for your next appointment:   In Person  Provider:   Jenne Campus, MD   Other Instructions

## 2019-10-24 ENCOUNTER — Encounter: Payer: Self-pay | Admitting: General Practice

## 2019-11-12 ENCOUNTER — Ambulatory Visit: Payer: No Typology Code available for payment source | Admitting: Cardiology

## 2019-11-20 ENCOUNTER — Other Ambulatory Visit: Payer: Self-pay

## 2019-11-20 ENCOUNTER — Ambulatory Visit (INDEPENDENT_AMBULATORY_CARE_PROVIDER_SITE_OTHER): Payer: No Typology Code available for payment source | Admitting: Cardiology

## 2019-11-20 ENCOUNTER — Encounter: Payer: Self-pay | Admitting: Cardiology

## 2019-11-20 ENCOUNTER — Telehealth: Payer: Self-pay | Admitting: Cardiology

## 2019-11-20 VITALS — BP 132/88 | HR 86 | Ht 64.0 in | Wt 147.0 lb

## 2019-11-20 DIAGNOSIS — R55 Syncope and collapse: Secondary | ICD-10-CM

## 2019-11-20 DIAGNOSIS — I25118 Atherosclerotic heart disease of native coronary artery with other forms of angina pectoris: Secondary | ICD-10-CM

## 2019-11-20 DIAGNOSIS — I10 Essential (primary) hypertension: Secondary | ICD-10-CM | POA: Diagnosis not present

## 2019-11-20 DIAGNOSIS — E785 Hyperlipidemia, unspecified: Secondary | ICD-10-CM | POA: Diagnosis not present

## 2019-11-20 MED ORDER — RANOLAZINE ER 1000 MG PO TB12: 1000.0000 mg | ORAL_TABLET | Freq: Two times a day (BID) | ORAL | 1 refills | Status: DC

## 2019-11-20 NOTE — Telephone Encounter (Signed)
Called patient she reports she was in the barn and sweating today and her monitor fell off. Will consult with Dr. Agustin Cree to see if he would like a new one ordered.

## 2019-11-20 NOTE — Telephone Encounter (Signed)
Patient had a heart monitor put on while in the office today. She states it keeps falling off. Please advise.

## 2019-11-20 NOTE — Patient Instructions (Signed)
Medication Instructions:  Your physician recommends that you continue on your current medications as directed. Please refer to the Current Medication list given to you today.  *If you need a refill on your cardiac medications before your next appointment, please call your pharmacy*   Lab Work: None. If you have labs (blood work) drawn today and your tests are completely normal, you will receive your results only by: . MyChart Message (if you have MyChart) OR . A paper copy in the mail If you have any lab test that is abnormal or we need to change your treatment, we will call you to review the results.   Testing/Procedures: A zio monitor was ordered today. It will remain on for 7 days. You will then return monitor and event diary in provided box. It takes 1-2 weeks for report to be downloaded and returned to us. We will call you with the results. If monitor falls off or has orange flashing light, please call Zio for further instructions.      Follow-Up: At [facility], you and your health needs are our priority.  As part of our continuing mission to provide you with exceptional heart care, we have created designated Provider Care Teams.  These Care Teams include your primary Cardiologist (physician) and Advanced Practice Providers (APPs -  Physician Assistants and Nurse Practitioners) who all work together to provide you with the care you need, when you need it.  We recommend signing up for the patient portal called "MyChart".  Sign up information is provided on this After Visit Summary.  MyChart is used to connect with patients for Virtual Visits (Telemedicine).  Patients are able to view lab/test results, encounter notes, upcoming appointments, etc.  Non-urgent messages can be sent to your provider as well.   To learn more about what you can do with MyChart, go to [email or web address].    Your next appointment:   2 month(s)  The format for your next appointment:   In  Person  Provider:   Robert Krasowski, MD   Other Instructions   

## 2019-11-20 NOTE — Progress Notes (Signed)
Cardiology Office Note:    Date:  11/20/2019   ID:  Penny Boyer, DOB 1955/09/08, MRN [id number]  PCP:  [place]  Cardiologist:  Jenne Campus, MD    Referring MD: [place] [employer]   No chief complaint on file. Am doing fine  History of Present Illness:    Penny Boyer is a 64 y.o. female  with past medical history significant for coronary artery disease.She did have a cardiac catheterization in 2017 with PTCA and stenting of the circumflex artery done in [place]. After thatShe had a stress test done which showed ischemia in LAD territory cardiac catheterization after that showed diagonal branch disease and decision has been made to pursue medical therapy. Recently she was admitted to the hospital because of episode of syncope.  She told me that she got between 10-12 types of syncope interestingly when she was in the hospital with episode of syncope she was wearing Zio patch.  From the description she gave me while in the hospital I was suspecting she got vasovagal syncope however she was sent back to [place] because apparently monitor showed some significant pauses.  I was able finally to retrieve her monitor from [place].  The only thing I have is final conclusion of the monitor which showed no significant pauses but there was description of up to 2 1 AV conduction.  Last time I seen her I asked her to stop her propranolol and she did how she does.  She still described to have symptoms of dizziness.  Past Medical History:  Diagnosis Date  . Coronary artery disease involving native coronary artery has post PTCA and stenting of the circumflex artery in March 2017 of native heart 07/28/2016   Overview:  PTCA and stent of LCX artery in March 2017 in [place]  . Dyslipidemia (high LDL; low HDL) 07/28/2016  . Hyperlipidemia   . Hypertension   . Precordial pain 07/28/2016    Past Surgical History:  Procedure Laterality Date  . ABDOMINAL HYSTERECTOMY    . APPENDECTOMY     . CARDIAC CATHETERIZATION    . CHOLECYSTECTOMY    . FOOT SURGERY    . INTRAVASCULAR PRESSURE WIRE/FFR STUDY N/A 12/20/2018   Procedure: INTRAVASCULAR PRESSURE WIRE/FFR STUDY;  Surgeon: Leonie Man, MD;  Location: [facility];  Service: Cardiovascular;  Laterality: N/A;  . LEFT HEART CATH AND CORONARY ANGIOGRAPHY N/A 12/20/2018   Procedure: LEFT HEART CATH AND CORONARY ANGIOGRAPHY;  Surgeon: Leonie Man, MD;  Location: [facility];  Service: Cardiovascular;  Laterality: N/A;  . MANDIBLE FRACTURE SURGERY    . Ulnar Surgery    . WRIST FRACTURE SURGERY      Current Medications: Current Meds  Medication Sig  . albuterol (VENTOLIN HFA) 108 (90 Base) MCG/ACT inhaler Inhale 2 puffs into the lungs every 6 (six) hours as needed for wheezing or shortness of breath.  . Alirocumab (PRALUENT) 75 MG/ML SOAJ Inject into the skin every 14 (fourteen) days.  [name] aspirin (GOODSENSE ASPIRIN) 325 MG tablet Take 325 mg by mouth at bedtime.   . famotidine-calcium carbonate-magnesium hydroxide (PEPCID COMPLETE) 10-800-165 MG chewable tablet Chew 1 tablet by mouth daily as needed (acid reflux).  [name] ibuprofen (ADVIL) 200 MG tablet Take 800 mg by mouth every 6 (six) hours as needed for headache or moderate pain.  . isosorbide mononitrate (IMDUR) 120 MG 24 hr tablet Take 1 tablet (120 mg total) by mouth daily.  [name] losartan (COZAAR) 100 MG tablet Take  1 tablet (100 mg total) by mouth daily.  . nitroGLYCERIN (NITROSTAT) 0.4 MG SL tablet Place 1 tablet (0.4 mg total) under the tongue as needed for chest pain.  [name] propranolol ER (INDERAL LA) 60 MG 24 hr capsule Take 1 capsule (60 mg total) by mouth daily.  . ranolazine (RANEXA) 1000 MG SR tablet Take 1 tablet (1,000 mg total) by mouth 2 (two) times daily.     Allergies:   Atorvastatin and Hydrochlorothiazide w-triamterene   Social History   Socioeconomic History  . Marital status: Married    Spouse name: Not on file  . Number of children: Not on  file  . Years of education: Not on file  . Highest education level: Not on file  Occupational History  . Not on file  Tobacco Use  . Smoking status: Former Research scientist (life sciences)  . Smokeless tobacco: Never Used  Vaping Use  . Vaping Use: Never used  Substance and Sexual Activity  . Alcohol use: Yes  . Drug use: No  . Sexual activity: Not on file  Other Topics Concern  . Not on file  Social History Narrative  . Not on file   Social Determinants of Health   Financial Resource Strain:   . Difficulty of Paying Living Expenses:   Food Insecurity:   . Worried About Charity fundraiser in the Last Year:   . Arboriculturist in the Last Year:   Transportation Needs:   . Film/video editor (Medical):   [name] Lack of Transportation (Non-Medical):   Physical Activity:   . Days of Exercise per Week:   . Minutes of Exercise per Session:   Stress:   . Feeling of Stress :   Social Connections:   . Frequency of Communication with Friends and Family:   . Frequency of Social Gatherings with Friends and Family:   . Attends Religious Services:   . Active Member of Clubs or Organizations:   . Attends Archivist Meetings:   [name] Marital Status:      Family History: The patient's family history includes Cancer in her mother; Heart disease in her brother, brother, and father. ROS:   Please see the history of present illness.    All 14 point review of systems negative except as described per history of present illness  EKGs/Labs/Other Studies Reviewed:    I did review Holter monitor that was done by [place] studies dated Sep 26, 2019 which showed underlying rhythm sinus with average heart rate 77 ranging from 36 to 126 bpm, PACs PVCs were present rare couplets occasional ventricular bigeminy there was 1 run of supraventricular tachycardia only 4 beats at rate of 120, no ventricular tachycardia there were 12 episode of 2-1 AV block lasting for total of 35 seconds.  No significant pauses no atrial  fibrillation there was 7 trigger events by patient showing sinus rhythm  Recent Labs: 12/13/2018: BUN 18; Creatinine, Ser 0.82; Hemoglobin 13.4; Platelets 254; Potassium 3.7; Sodium 142  Recent Lipid Panel    Component Value Date/Time   CHOL 267 (H) 05/01/2019 1124   TRIG 117 05/01/2019 1124   HDL 64 05/01/2019 1124   CHOLHDL 4.2 05/01/2019 1124   LDLCALC 182 (H) 05/01/2019 1124    Physical Exam:    VS:  BP 132/88 (BP Location: Left Arm, Patient Position: Sitting, Cuff Size: Normal)   Pulse 86   Ht 5\' 4"  (1.626 m)   Wt 147 lb (66.7 kg)   SpO2 94%  BMI 25.23 kg/m     Wt Readings from Last 3 Encounters:  11/20/19 147 lb (66.7 kg)  10/18/19 148 lb (67.1 kg)  06/04/19 153 lb (69.4 kg)     GEN:  Well nourished, well developed in no acute distress HEENT: Normal NECK: No JVD; No carotid bruits LYMPHATICS: No lymphadenopathy CARDIAC: RRR, no murmurs, no rubs, no gallops RESPIRATORY:  Clear to auscultation without rales, wheezing or rhonchi  ABDOMEN: Soft, non-tender, non-distended MUSCULOSKELETAL:  No edema; No deformity  SKIN: Warm and dry LOWER EXTREMITIES: no swelling NEUROLOGIC:  Alert and oriented x 3 PSYCHIATRIC:  Normal affect   ASSESSMENT:    1. Syncope and collapse   2. Essential hypertension   3. 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 Dyslipidemia (high LDL; low HDL)    PLAN:    In order of problems listed above:  1. Syncope and collapse.  Beta-blocker has been withdrawn based on the fact that the monitor done by [facility] showed some 2-1 AV conduction.  She still complaining of having symptoms of dizziness.  I will ask you to wear monitor again now without beta-blocker to see if there is still an episode of 2-1 AV conduction.  There was no significant pauses do not alter her monitor. 2. Essential hypertension blood pressure seems to be well controlled continue present  management. 3. Coronary artery disease with [place] post stenting to the circumflex artery done in 2017, cardiac catheterization done in 2020 showed moderate two-vessel disease proven by FFR, normal left ventricle ejection fraction.  She is doing well from that point review.  No chest pain 4. Dyslipidemia: She is on Praluent which I will continue.  The plan for now will be wear monitor while off beta-blocker and see if she gets any significant bradycardia get explain her syncope.   Medication Adjustments/Labs and Tests Ordered: Current medicines are reviewed at length with the patient today.  Concerns regarding medicines are outlined above.  No orders of the defined types were placed in this encounter.  Medication changes: No orders of the defined types were placed in this encounter.   Signed, Park Liter, MD, [facility] 11/20/2019 10:22 AM    [place]

## 2019-11-21 ENCOUNTER — Telehealth: Payer: Self-pay | Admitting: *Deleted

## 2019-11-21 MED ORDER — RANOLAZINE ER 1000 MG PO TB12: 1000.0000 mg | ORAL_TABLET | Freq: Two times a day (BID) | ORAL | 1 refills | Status: DC

## 2019-11-21 NOTE — Telephone Encounter (Signed)
Spoke with pt and asked her to please refrain from working outside for at least 3 days so that the monitor will stay on and we can get a good reading. Pt stated would do that this time. Had another monitor sent to her house. Also sent Rx for Ranexa to the [place], was sent to [facility] by mistake.

## 2019-11-21 NOTE — Telephone Encounter (Signed)
Would be nice to have at least 24 hours of monitoring so please order another 1

## 2019-11-21 NOTE — Telephone Encounter (Signed)
Rx refill sent to pharmacy. Rx was sent to wrong pharmacy and so sent to the [facility]

## 2019-12-18 ENCOUNTER — Telehealth: Payer: Self-pay

## 2019-12-18 NOTE — Telephone Encounter (Signed)
Call to [facility] / Zio spoke with [facility] in customer service regarding unreturned patches serial # [id number]. [place] states that the shipment prepaid label shows that the it has never been scanned by the postal service which means it has been lost in the mail..  Will notify Dr Agustin Cree to advise.

## 2019-12-18 NOTE — Telephone Encounter (Signed)
Per Zio patient patches have not been returned. Called spoke with patient who states that she returned the monitor after 1 week as directed. Told patient that we would follow up with [place] to further research and let her know outcome.

## 2019-12-18 NOTE — Telephone Encounter (Signed)
Sent to Dr Agustin Cree to advise

## 2020-01-22 ENCOUNTER — Encounter: Payer: Self-pay | Admitting: Cardiology

## 2020-01-22 ENCOUNTER — Other Ambulatory Visit: Payer: Self-pay

## 2020-01-22 ENCOUNTER — Ambulatory Visit (INDEPENDENT_AMBULATORY_CARE_PROVIDER_SITE_OTHER): Payer: No Typology Code available for payment source | Admitting: Cardiology

## 2020-01-22 ENCOUNTER — Ambulatory Visit (INDEPENDENT_AMBULATORY_CARE_PROVIDER_SITE_OTHER): Payer: No Typology Code available for payment source

## 2020-01-22 VITALS — BP 110/70 | HR 94 | Ht 64.0 in | Wt 147.8 lb

## 2020-01-22 DIAGNOSIS — R55 Syncope and collapse: Secondary | ICD-10-CM

## 2020-01-22 DIAGNOSIS — I10 Essential (primary) hypertension: Secondary | ICD-10-CM | POA: Diagnosis not present

## 2020-01-22 DIAGNOSIS — E785 Hyperlipidemia, unspecified: Secondary | ICD-10-CM

## 2020-01-22 DIAGNOSIS — I25118 Atherosclerotic heart disease of native coronary artery with other forms of angina pectoris: Secondary | ICD-10-CM

## 2020-01-22 NOTE — Progress Notes (Signed)
Cardiology Office Note:    Date:  01/22/2020   ID:  Penny Boyer, DOB Nov 10, 1955, MRN [id number]  PCP:  [facility] [facility]  Cardiologist:  Jenne Campus, MD    Referring MD: [place]   No chief complaint on file. I am still passing out  History of Present Illness:    Penny Boyer is a 64 y.o. female  with past medical history significant for coronary artery disease.She did have a cardiac catheterization in 2017 with PTCA and stenting of the circumflex artery done in [place]. After thatShe had a stress test done which showed ischemia in [place] cardiac catheterization after that showed diagonal branch disease and decision has been made to pursue medical therapy. Recently she was admitted to the hospital because of episode of syncope. She told me that she got between 10-12 types of syncope interestingly when she was in the hospital with episode of syncope she was wearing Zio patch. From the description she gave me while in the hospital I was suspecting she got vasovagal syncope however she was sent back to [place] because apparently monitor showed some significant pauses. I did receive monitor from [place] and statements in the report that states "there are 12 episodes of 2-1 AV block lasting of total 35 seconds".  That monitor was placed when she was still taking beta-blocker.  There is no specific description of AV block I do know if it is type I or type II and I denied that was happening during the symptoms are not during the day during the night.  A lot of unanswered question based on this monitor.  I wanted her to wear monitor again now without beta-blocker however with first to monitor she gets some technical difficulty and she was not able to wear them did not work properly and then last 1 she did wear she send it back to monitoring company however they never receive it.  Suspicion is that was lost in the mail.  I have told me that is very unusual scenario.  She  still reports having episode of passing out she did not injure herself.  Denies have any palpitations no dizziness before that.  Past Medical History:  Diagnosis Date  . Coronary artery disease involving native coronary artery has post PTCA and stenting of the circumflex artery in March 2017 of native heart 07/28/2016   Overview:  PTCA and stent of LCX artery in March 2017 in [place]  . Dyslipidemia (high LDL; low HDL) 07/28/2016  . Hyperlipidemia   . Hypertension   . Precordial pain 07/28/2016    Past Surgical History:  Procedure Laterality Date  . ABDOMINAL HYSTERECTOMY    . APPENDECTOMY    . CARDIAC CATHETERIZATION    . CHOLECYSTECTOMY    . FOOT SURGERY    . INTRAVASCULAR PRESSURE WIRE/FFR STUDY N/A 12/20/2018   Procedure: INTRAVASCULAR PRESSURE WIRE/FFR STUDY;  Surgeon: Leonie Man, MD;  Location: [facility];  Service: Cardiovascular;  Laterality: N/A;  . LEFT HEART CATH AND CORONARY ANGIOGRAPHY N/A 12/20/2018   Procedure: LEFT HEART CATH AND CORONARY ANGIOGRAPHY;  Surgeon: Leonie Man, MD;  Location: [facility];  Service: Cardiovascular;  Laterality: N/A;  . MANDIBLE FRACTURE SURGERY    . Ulnar Surgery    . WRIST FRACTURE SURGERY      Current Medications: Current Meds  Medication Sig  . albuterol (VENTOLIN HFA) 108 (90 Base) MCG/ACT inhaler Inhale 2 puffs into the lungs every 6 (six)  hours as needed for wheezing or shortness of breath.  . Alirocumab (PRALUENT) 75 MG/ML SOAJ Inject into the skin every 14 (fourteen) days.  [name] aspirin (GOODSENSE ASPIRIN) 325 MG tablet Take 325 mg by mouth at bedtime.   . famotidine-calcium carbonate-magnesium hydroxide (PEPCID COMPLETE) 10-800-165 MG chewable tablet Chew 1 tablet by mouth daily as needed (acid reflux).  [name] ibuprofen (ADVIL) 200 MG tablet Take 800 mg by mouth every 6 (six) hours as needed for headache or moderate pain.  . isosorbide mononitrate (IMDUR) 120 MG 24 hr tablet Take 1 tablet (120 mg total) by mouth  daily.  [name] losartan (COZAAR) 100 MG tablet Take 1 tablet (100 mg total) by mouth daily.  . nitroGLYCERIN (NITROSTAT) 0.4 MG SL tablet Place 1 tablet (0.4 mg total) under the tongue as needed for chest pain.  [name] propranolol ER (INDERAL LA) 60 MG 24 hr capsule Take 1 capsule (60 mg total) by mouth daily.  . ranolazine (RANEXA) 1000 MG SR tablet Take 1 tablet (1,000 mg total) by mouth 2 (two) times daily.     Allergies:   Atorvastatin and Hydrochlorothiazide w-triamterene   Social History   Socioeconomic History  . Marital status: Married    Spouse name: Not on file  . Number of children: Not on file  . Years of education: Not on file  . Highest education level: Not on file  Occupational History  . Not on file  Tobacco Use  . Smoking status: Former Research scientist (life sciences)  . Smokeless tobacco: Never Used  Vaping Use  . Vaping Use: Never used  Substance and Sexual Activity  . Alcohol use: Yes  . Drug use: No  . Sexual activity: Not on file  Other Topics Concern  . Not on file  Social History Narrative  . Not on file   Social Determinants of Health   Financial Resource Strain:   . Difficulty of Paying Living Expenses: Not on file  Food Insecurity:   . Worried About Charity fundraiser in the Last Year: Not on file  . Ran Out of Food in the Last Year: Not on file  Transportation Needs:   . Lack of Transportation (Medical): Not on file  . Lack of Transportation (Non-Medical): Not on file  Physical Activity:   . Days of Exercise per Week: Not on file  . Minutes of Exercise per Session: Not on file  Stress:   . Feeling of Stress : Not on file  Social Connections:   . Frequency of Communication with Friends and Family: Not on file  . Frequency of Social Gatherings with Friends and Family: Not on file  . Attends Religious Services: Not on file  . Active Member of Clubs or Organizations: Not on file  . Attends Archivist Meetings: Not on file  . Marital Status: Not on file      Family History: The patient's family history includes Cancer in her mother; Heart disease in her brother, brother, and father. ROS:   Please see the history of present illness.    All 14 point review of systems negative except as described per history of present illness  EKGs/Labs/Other Studies Reviewed:      Recent Labs: No results found for requested labs within last 8760 hours.  Recent Lipid Panel    Component Value Date/Time   CHOL 267 (H) 05/01/2019 1124   TRIG 117 05/01/2019 1124   HDL 64 05/01/2019 1124   CHOLHDL 4.2 05/01/2019 1124   LDLCALC 182 (H)  05/01/2019 1124    Physical Exam:    VS:  BP 110/70   Pulse 94   Ht 5\' 4"  (1.626 m)   Wt 147 lb 12.8 oz (67 kg)   SpO2 97%   BMI 25.37 kg/m     Wt Readings from Last 3 Encounters:  01/22/20 147 lb 12.8 oz (67 kg)  11/20/19 147 lb (66.7 kg)  10/18/19 148 lb (67.1 kg)     GEN:  Well nourished, well developed in no acute distress HEENT: Normal NECK: No JVD; No carotid bruits LYMPHATICS: No lymphadenopathy CARDIAC: RRR, no murmurs, no rubs, no gallops RESPIRATORY:  Clear to auscultation without rales, wheezing or rhonchi  ABDOMEN: Soft, non-tender, non-distended MUSCULOSKELETAL:  No edema; No deformity  SKIN: Warm and dry LOWER EXTREMITIES: no swelling NEUROLOGIC:  Alert and oriented x 3 PSYCHIATRIC:  Normal affect   ASSESSMENT:    1. Coronary artery disease involving native coronary artery of native heart with other form of angina pectoris ([place])   2. Syncope and collapse   3. Essential hypertension   4. Dyslipidemia (high LDL; low HDL)    PLAN:    In order of problems listed above:  1. Coronary artery disease stable from that point review denies have any chest pain tightness squeezing pressure burning chest 2. Syncope and collapse: We still did not get monitor back.  We will try to put the monitor although this continues to monitor her. 3. Essential hypertension blood pressure well controlled  continue present management. 4. Dyslipidemia: She is on Praluent which I will continue   Medication Adjustments/Labs and Tests Ordered: Current medicines are reviewed at length with the patient today.  Concerns regarding medicines are outlined above.  No orders of the defined types were placed in this encounter.  Medication changes: No orders of the defined types were placed in this encounter.   Signed, Park Liter, MD, [facility] 01/22/2020 2:22 PM    [place]

## 2020-01-22 NOTE — Patient Instructions (Signed)
Medication Instructions:  Your physician recommends that you continue on your current medications as directed. Please refer to the Current Medication list given to you today.  *If you need a refill on your cardiac medications before your next appointment, please call your pharmacy*   Lab Work: None. If you have labs (blood work) drawn today and your tests are completely normal, you will receive your results only by: . MyChart Message (if you have MyChart) OR . A paper copy in the mail If you have any lab test that is abnormal or we need to change your treatment, we will call you to review the results.   Testing/Procedures: A zio monitor was ordered today. It will remain on for 7 days. You will then return monitor and event diary in provided box. It takes 1-2 weeks for report to be downloaded and returned to us. We will call you with the results. If monitor falls off or has orange flashing light, please call Zio for further instructions.      Follow-Up: At [facility], you and your health needs are our priority.  As part of our continuing mission to provide you with exceptional heart care, we have created designated Provider Care Teams.  These Care Teams include your primary Cardiologist (physician) and Advanced Practice Providers (APPs -  Physician Assistants and Nurse Practitioners) who all work together to provide you with the care you need, when you need it.  We recommend signing up for the patient portal called "MyChart".  Sign up information is provided on this After Visit Summary.  MyChart is used to connect with patients for Virtual Visits (Telemedicine).  Patients are able to view lab/test results, encounter notes, upcoming appointments, etc.  Non-urgent messages can be sent to your provider as well.   To learn more about what you can do with MyChart, go to [email or web address].    Your next appointment:   2 month(s)  The format for your next appointment:   In  Person  Provider:   Robert Krasowski, MD   Other Instructions   

## 2020-01-25 ENCOUNTER — Telehealth: Payer: Self-pay | Admitting: Cardiology

## 2020-01-25 NOTE — Telephone Encounter (Signed)
Penny Boyer is calling to inform the office a new monitor has been mailed out to the pt due to her current one not working properly. Reference #: [id number].

## 2020-01-31 NOTE — Telephone Encounter (Signed)
Katharine Look with [employer] is following up regarding Zio monitor. Per Theone Stanley sent patient the monitor, however, the patient stated she never received the monitor and it may have been lost in the mail. Katharine Look states [employer] recently sent a replacement monitor. Penny Boyer's call back number is [phone] reference#: [id number].

## 2020-01-31 NOTE — Telephone Encounter (Signed)
Spoke with Katharine Look with [employer] and she wanted to let us know that another Zio patch is being sent priority to the pt.

## 2020-02-01 ENCOUNTER — Other Ambulatory Visit: Payer: Self-pay

## 2020-02-01 ENCOUNTER — Telehealth: Payer: Self-pay | Admitting: Cardiology

## 2020-02-01 ENCOUNTER — Emergency Department (HOSPITAL_COMMUNITY): Payer: No Typology Code available for payment source

## 2020-02-01 ENCOUNTER — Observation Stay (HOSPITAL_COMMUNITY)
Admission: EM | Admit: 2020-02-01 | Discharge: 2020-02-02 | Disposition: A | Discharge status: Home / Self Care | Payer: No Typology Code available for payment source | Attending: Family Medicine | Admitting: Family Medicine

## 2020-02-01 DIAGNOSIS — Z79899 Other long term (current) drug therapy: Secondary | ICD-10-CM | POA: Diagnosis not present

## 2020-02-01 DIAGNOSIS — Z7982 Long term (current) use of aspirin: Secondary | ICD-10-CM | POA: Insufficient documentation

## 2020-02-01 DIAGNOSIS — Z20822 Contact with and (suspected) exposure to covid-19: Secondary | ICD-10-CM | POA: Insufficient documentation

## 2020-02-01 DIAGNOSIS — I2510 Atherosclerotic heart disease of native coronary artery without angina pectoris: Principal | ICD-10-CM | POA: Diagnosis present

## 2020-02-01 DIAGNOSIS — R55 Syncope and collapse: Secondary | ICD-10-CM | POA: Diagnosis not present

## 2020-02-01 DIAGNOSIS — I119 Hypertensive heart disease without heart failure: Secondary | ICD-10-CM | POA: Insufficient documentation

## 2020-02-01 DIAGNOSIS — R079 Chest pain, unspecified: Secondary | ICD-10-CM | POA: Diagnosis present

## 2020-02-01 DIAGNOSIS — E785 Hyperlipidemia, unspecified: Secondary | ICD-10-CM | POA: Diagnosis present

## 2020-02-01 DIAGNOSIS — Z87891 Personal history of nicotine dependence: Secondary | ICD-10-CM | POA: Diagnosis not present

## 2020-02-01 DIAGNOSIS — I10 Essential (primary) hypertension: Secondary | ICD-10-CM | POA: Diagnosis present

## 2020-02-01 LAB — BASIC METABOLIC PANEL
Anion gap: 10 (ref 5–15)
BUN: 21 mg/dL (ref 8–23)
CO2: 26 mmol/L (ref 22–32)
Calcium: 9.9 mg/dL (ref 8.9–10.3)
Chloride: 100 mmol/L (ref 98–111)
Creatinine, Ser: 0.94 mg/dL (ref 0.44–1.00)
GFR calc Af Amer: >60 mL/min (ref >60)
GFR calc non Af Amer: >60 mL/min (ref >60)
Glucose, Bld: 98 mg/dL (ref 70–99)
Potassium: 4 mmol/L (ref 3.5–5.1)
Sodium: 136 mmol/L (ref 135–145)

## 2020-02-01 LAB — CBC
HCT: 42.2 % (ref 36.0–46.0)
Hemoglobin: 14.2 g/dL (ref 12.0–15.0)
MCH: 29.3 pg (ref 26.0–34.0)
MCHC: 33.6 g/dL (ref 30.0–36.0)
MCV: 87 fL (ref 80.0–100.0)
Platelets: 284 10*3/uL (ref 150–400)
RBC: 4.85 MIL/uL (ref 3.87–5.11)
RDW: 12.6 % (ref 11.5–15.5)
WBC: 7.7 10*3/uL (ref 4.0–10.5)
nRBC: 0 % (ref 0.0–0.2)

## 2020-02-01 LAB — TROPONIN I (HIGH SENSITIVITY): Troponin I (High Sensitivity): 3 ng/L (ref <18)

## 2020-02-01 NOTE — Telephone Encounter (Signed)
New message:     Vernie Shanks from [facility] calling concering this patient ref number [id number]. Concering patient mointor.

## 2020-02-01 NOTE — Telephone Encounter (Signed)
Called [employer]. They report that there has been some transmission issues with the live portion of this monitor. We will get the finally report like we do with a regular zio once received however we will not get daily report. [employer] spoke to patient she doesn't want a new monitor she will just wear the one she already has on.

## 2020-02-01 NOTE — ED Triage Notes (Signed)
Patient stated she's been having chest pain and concern for Zio patch was having problem.

## 2020-02-02 ENCOUNTER — Observation Stay (HOSPITAL_COMMUNITY): Payer: No Typology Code available for payment source

## 2020-02-02 ENCOUNTER — Encounter (HOSPITAL_COMMUNITY): Payer: Self-pay | Admitting: Student

## 2020-02-02 DIAGNOSIS — I10 Essential (primary) hypertension: Secondary | ICD-10-CM

## 2020-02-02 DIAGNOSIS — R079 Chest pain, unspecified: Secondary | ICD-10-CM

## 2020-02-02 DIAGNOSIS — I2510 Atherosclerotic heart disease of native coronary artery without angina pectoris: Secondary | ICD-10-CM

## 2020-02-02 DIAGNOSIS — R55 Syncope and collapse: Secondary | ICD-10-CM

## 2020-02-02 DIAGNOSIS — E785 Hyperlipidemia, unspecified: Secondary | ICD-10-CM

## 2020-02-02 DIAGNOSIS — I25118 Atherosclerotic heart disease of native coronary artery with other forms of angina pectoris: Secondary | ICD-10-CM | POA: Diagnosis not present

## 2020-02-02 HISTORY — DX: Chest pain, unspecified: R07.9

## 2020-02-02 LAB — HIV ANTIBODY (ROUTINE TESTING W REFLEX): HIV Screen 4th Generation wRfx: NONREACTIVE

## 2020-02-02 LAB — LIPID PANEL
Cholesterol: 269 mg/dL — ABNORMAL HIGH (ref 0–200)
HDL: 59 mg/dL (ref >40)
LDL Cholesterol: 183 mg/dL — ABNORMAL HIGH (ref 0–99)
Total CHOL/HDL Ratio: 4.6 RATIO
Triglycerides: 133 mg/dL (ref <150)
VLDL: 27 mg/dL (ref 0–40)

## 2020-02-02 LAB — SARS CORONAVIRUS 2 BY RT PCR (HOSPITAL ORDER, PERFORMED IN ~~LOC~~ HOSPITAL LAB): SARS Coronavirus 2: NEGATIVE

## 2020-02-02 LAB — TROPONIN I (HIGH SENSITIVITY): Troponin I (High Sensitivity): <2 ng/L (ref <18)

## 2020-02-02 LAB — TSH: TSH: 2.663 u[IU]/mL (ref 0.350–4.500)

## 2020-02-02 MED ORDER — RANOLAZINE ER 500 MG PO TB12
1000.0000 mg | ORAL_TABLET | Freq: Two times a day (BID) | ORAL | Status: DC
  Administered 2020-02-02: 1000 mg via ORAL
  Filled 2020-02-02 (×2): qty 2

## 2020-02-02 MED ORDER — ASPIRIN EC 81 MG PO TBEC: 81.0000 mg | DELAYED_RELEASE_TABLET | Freq: Every day | ORAL | Status: DC

## 2020-02-02 MED ORDER — LOSARTAN POTASSIUM 50 MG PO TABS
100.0000 mg | ORAL_TABLET | Freq: Every day | ORAL | Status: DC
  Administered 2020-02-02: 100 mg via ORAL
  Filled 2020-02-02: qty 2

## 2020-02-02 MED ORDER — ASPIRIN 325 MG PO TABS
325.0000 mg | ORAL_TABLET | Freq: Every day | ORAL | Status: DC
  Filled 2020-02-02: qty 1

## 2020-02-02 MED ORDER — ENOXAPARIN SODIUM 40 MG/0.4ML ~~LOC~~ SOLN
40.0000 mg | SUBCUTANEOUS | Status: DC
  Administered 2020-02-02: 40 mg via SUBCUTANEOUS
  Filled 2020-02-02: qty 0.4

## 2020-02-02 MED ORDER — ISOSORBIDE MONONITRATE ER 30 MG PO TB24
120.0000 mg | ORAL_TABLET | Freq: Every day | ORAL | Status: DC
  Administered 2020-02-02: 120 mg via ORAL
  Filled 2020-02-02: qty 4

## 2020-02-02 MED ORDER — ONDANSETRON HCL 4 MG/2ML IJ SOLN: 4.0000 mg | Freq: Four times a day (QID) | INTRAMUSCULAR | Status: DC | PRN

## 2020-02-02 MED ORDER — IBUPROFEN 800 MG PO TABS
800.0000 mg | ORAL_TABLET | Freq: Four times a day (QID) | ORAL | Status: DC | PRN
  Administered 2020-02-02: 800 mg via ORAL
  Filled 2020-02-02: qty 1

## 2020-02-02 MED ORDER — ACETAMINOPHEN 325 MG PO TABS: 650.0000 mg | ORAL_TABLET | ORAL | Status: DC | PRN

## 2020-02-02 NOTE — Progress Notes (Signed)
Subjective: Patient admitted this morning, see detailed H&P by Dr Alcario Drought 64 year old female with a history of CAD s/p left circumflex stent in 2017, hypertension, hyperlipidemia who has been having recurrent episodes of syncope.  Patient has been followed by cardiology.  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  But patient has been on beta-blocker at that time which has been discontinued by cardiology.  Patient had 2 episodes of dizziness and palpitations but did not pass out on Thursday.  She has Zio AT in place but hospital quit working 2 days ago.  Cardiology is trying to contact the representative from 0 to see if they can extract the data.   Vitals:   02/02/20 0915 02/02/20 0930  BP: 140/87 140/80  Pulse: 67 71  Resp: 13 12  Temp:    SpO2: 100% 96%      A/P Recurrent syncope-unclear etiology.  CT head obtained this morning shows no acute abnormality.  Continue telemetry monitoring.  Cardiology following.  Hypertension-patient was taking propranolol which was stopped by PCP on 10/18/2019  CAD-continue Ranexa, aspirin.  Hyperlipidemia-patient is on q. 14 days injection of [place] [facility] Pager- [phone]

## 2020-02-02 NOTE — ED Notes (Signed)
The pt started passing out Thursday  She has been wearing a  Heart monitor intermittently for 2 weeks  She is now c/o a headache a and o x 4

## 2020-02-02 NOTE — H&P (Signed)
History and Physical    Penny Boyer ALP:[id number] DOB: 1956-04-30 DOA: 02/01/2020  PCP: [facility]  Patient coming from: Home  I have personally briefly reviewed patient's old medical records in [place]  Chief Complaint: CP syncope  HPI: Penny Boyer is a 64 y.o. female with medical history significant of CAD s/p LCx stent in 2017, has stenotic diag with positive stress test that cards is trying to treat medically.  HTN, HLD.  Dr. Agustin Cree has been evaluating pt for syncope.  Was admitted to hospital earlier this year for syncope.  Sounds like heart monitor she started wearing after that hospital stay to work this up showed episodes of 2-1 AV block.  Patient had been on beta blockers at that time but beta blockers were stopped by Dr. Agustin Cree.  Pt put back on heart monitor; but there have been various difficulties getting results from the monitor (one monitor was lost in mail, one had technical difficulty getting results, etc.)  Pt began having intermittent episodes of syncope and intermittent CP again on Thurs this week.  Presents to ED.   ED Course: Currently CP free, and asymptomatic other than a headache she reports.  Trop neg x2.  EKG unremarkable.   Review of Systems: As per HPI, otherwise all review of systems negative.  Past Medical History:  Diagnosis Date  . Coronary artery disease involving native coronary artery has post PTCA and stenting of the circumflex artery in March 2017 of native heart 07/28/2016   Overview:  PTCA and stent of LCX artery in March 2017 in [place]  . Dyslipidemia (high LDL; low HDL) 07/28/2016  . Hyperlipidemia   . Hypertension   . Precordial pain 07/28/2016    Past Surgical History:  Procedure Laterality Date  . ABDOMINAL HYSTERECTOMY    . APPENDECTOMY    . CARDIAC CATHETERIZATION    . CHOLECYSTECTOMY    . FOOT SURGERY    . INTRAVASCULAR PRESSURE WIRE/FFR STUDY N/A 12/20/2018   Procedure: INTRAVASCULAR  PRESSURE WIRE/FFR STUDY;  Surgeon: Leonie Man, MD;  Location: [facility];  Service: Cardiovascular;  Laterality: N/A;  . LEFT HEART CATH AND CORONARY ANGIOGRAPHY N/A 12/20/2018   Procedure: LEFT HEART CATH AND CORONARY ANGIOGRAPHY;  Surgeon: Leonie Man, MD;  Location: [facility];  Service: Cardiovascular;  Laterality: N/A;  . MANDIBLE FRACTURE SURGERY    . Ulnar Surgery    . WRIST FRACTURE SURGERY       reports that she has quit smoking. She has never used smokeless tobacco. She reports current alcohol use. She reports that she does not use drugs.  Allergies  Allergen Reactions  . Atorvastatin Other (See Comments)    JOINT PAIN  . Hydrochlorothiazide W-Triamterene Other (See Comments)    UNKNOWN    Family History  Problem Relation Age of Onset  . Cancer Mother   . Heart disease Father   . Heart disease Brother   . Heart disease Brother      Prior to Admission medications   Medication Sig Start Date End Date Taking? Authorizing Provider  albuterol (VENTOLIN HFA) 108 (90 Base) MCG/ACT inhaler Inhale 2 puffs into the lungs every 6 (six) hours as needed for wheezing or shortness of breath.    [provider]  Alirocumab (PRALUENT) 75 MG/ML SOAJ Inject into the skin every 14 (fourteen) days.    [provider]  aspirin (GOODSENSE ASPIRIN) 325 MG tablet Take 325 mg by mouth at bedtime.  [provider]  famotidine-calcium carbonate-magnesium hydroxide (PEPCID COMPLETE) 10-800-165 MG chewable tablet Chew 1 tablet by mouth daily as needed (acid reflux).    [provider]  ibuprofen (ADVIL) 200 MG tablet Take 800 mg by mouth every 6 (six) hours as needed for headache or moderate pain.    [provider]  isosorbide mononitrate (IMDUR) 120 MG 24 hr tablet Take 1 tablet (120 mg total) by mouth daily. 05/01/19   Park Liter, MD  losartan (COZAAR) 100 MG tablet Take 1 tablet (100 mg total) by mouth daily. 06/04/19    Park Liter, MD  nitroGLYCERIN (NITROSTAT) 0.4 MG SL tablet Place 1 tablet (0.4 mg total) under the tongue as needed for chest pain. 06/15/19   Park Liter, MD  propranolol ER (INDERAL LA) 60 MG 24 hr capsule Take 1 capsule (60 mg total) by mouth daily. 10/18/19   Park Liter, MD  ranolazine (RANEXA) 1000 MG SR tablet Take 1 tablet (1,000 mg total) by mouth 2 (two) times daily. 11/21/19   Park Liter, MD    Physical Exam: Vitals:   02/01/20 2152 02/02/20 0213 02/02/20 0514 02/02/20 0530  BP: (!) 149/95 129/79  (!) 140/101  Pulse: 82 69  71  Resp: 16 18  11   Temp:      TempSrc:      SpO2: 98% 99%  99%  Weight:   67 kg   Height:   5\' 4"  (1.626 m)     Constitutional: NAD, calm, comfortable Eyes: PERRL, lids and conjunctivae normal ENMT: Mucous membranes are moist. Posterior pharynx clear of any exudate or lesions.Normal dentition.  Neck: normal, supple, no masses, no thyromegaly Respiratory: clear to auscultation bilaterally, no wheezing, no crackles. Normal respiratory effort. No accessory muscle use.  Cardiovascular: Regular rate and rhythm, no murmurs / rubs / gallops. No extremity edema. 2+ pedal pulses. No carotid bruits.  Abdomen: no tenderness, no masses palpated. No hepatosplenomegaly. Bowel sounds positive.  Musculoskeletal: no clubbing / cyanosis. No joint deformity upper and lower extremities. Good ROM, no contractures. Normal muscle tone.  Skin: no rashes, lesions, ulcers. No induration Neurologic: CN 2-12 grossly intact. Sensation intact, DTR normal. Strength 5/5 in all 4.  Psychiatric: Normal judgment and insight. Alert and oriented x 3. Normal mood.    Labs on Admission: I have personally reviewed following labs and imaging studies  CBC: Recent Labs  Lab 02/01/20 1558  WBC 7.7  HGB 14.2  HCT 42.2  MCV 87.0  PLT 716   Basic Metabolic Panel: Recent Labs  Lab 02/01/20 1558  NA 136  K 4.0  CL 100  CO2 26  GLUCOSE 98  BUN 21    CREATININE 0.94  CALCIUM 9.9   GFR: Estimated Creatinine Clearance: 58.4 mL/min (by C-G formula based on SCr of 0.94 mg/dL). Liver Function Tests: No results for input(s): AST, ALT, ALKPHOS, BILITOT, PROT, ALBUMIN in the last 168 hours. No results for input(s): LIPASE, AMYLASE in the last 168 hours. No results for input(s): AMMONIA in the last 168 hours. Coagulation Profile: No results for input(s): INR, PROTIME in the last 168 hours. Cardiac Enzymes: No results for input(s): CKTOTAL, CKMB, CKMBINDEX, TROPONINI in the last 168 hours. BNP (last 3 results) No results for input(s): PROBNP in the last 8760 hours. HbA1C: No results for input(s): HGBA1C in the last 72 hours. CBG: No results for input(s): GLUCAP in the last 168 hours. Lipid Profile: No results for input(s): CHOL, HDL, LDLCALC, TRIG, CHOLHDL,  LDLDIRECT in the last 72 hours. Thyroid Function Tests: No results for input(s): TSH, T4TOTAL, FREET4, T3FREE, THYROIDAB in the last 72 hours. Anemia Panel: No results for input(s): VITAMINB12, FOLATE, FERRITIN, TIBC, IRON, RETICCTPCT in the last 72 hours. Urine analysis: No results found for: COLORURINE, APPEARANCEUR, LABSPEC, [place], GLUCOSEU, HGBUR, BILIRUBINUR, KETONESUR, PROTEINUR, UROBILINOGEN, NITRITE, LEUKOCYTESUR  Radiological Exams on Admission: DG Chest 2 View  Result Date: 02/01/2020 CLINICAL DATA:  Chest pain, Zio heart monitor patch EXAM: CHEST - 2 VIEW COMPARISON:  Chest x-ray 12/13/2018, CT abdomen pelvis 08/01/2005 FINDINGS: Wireless heart monitor overlying the left chest wall. The heart size and mediastinal contours are within normal limits. Well-defined 1 cm round density overlying the left lower lobe likely represents a nipple shadow or granuloma as seen on CT abdomen pelvis 08/01/2005. No focal consolidation. No pulmonary edema. No pleural effusion. No pneumothorax. No acute osseous abnormality. Right upper quadrant surgical clips. IMPRESSION: No active  cardiopulmonary disease. Electronically Signed   By: Iven Finn M.D.   On: 02/01/2020 16:35    EKG: Independently reviewed.  Assessment/Plan Principal Problem:   Syncope and collapse Active Problems:   Coronary artery disease involving native coronary artery has post PTCA and stenting of the circumflex artery in March 2017 of native heart   Dyslipidemia (high LDL; low HDL)   Hypertension   Chest pain    1. Syncope episodes, and intermittent CP episodes - 1. 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 2. Beta blocker now on hold though. 3. Trop neg 4. Tele monitor 5. Message sent to [facility] for Cards eval in AM, hopefully they can get the info off of her heart monitor she is wearing to make the diagnosis. 6. Keeping pt NPO until then, just-in-case they end up needing to do something procedural. 2. HTN - 1. Med rec pending 2. Cont home meds 3. BB stopped a couple of months back by Dr. Agustin Cree (6/3 office note). 3. CAD - 1. Trop neg x2 2. Cont ranexa 3. Cont ASA 4. HLD - 1. Med rec pending, but looks like she is on Q14 day injections of Praluent  DVT prophylaxis: Lovenox Code Status: Full Family Communication: No family in room Disposition Plan: Home after syncope / CP work up [employer] called: Message sent to [employer] [facility] for cards eval in AM Admission status: Place in [place]   Theresa Wedel, [facility]  How to contact the [facility] Attending or Consulting provider [place] or covering provider during after hours [place], for this patient?  1. Check the care team in [facility] and look for a) attending/consulting TR[facility] provider listed and b) the [facility] team listed 2. Log into [email or web address]  Amion Physician Scheduling and messaging for groups and whole hospitals  On call and physician scheduling software for group practices, residents, hospitalists and other  medical providers for call, clinic, rotation and shift schedules. OnCall [employer] is a hospital-wide system for scheduling doctors and paging doctors on call. EasyPlot is for scientific plotting and data analysis.  [email or web address]  and use [place]'s universal password to access. If you do not have the password, please contact the hospital operator.  3. Locate the [facility] provider you are looking for under [facility] and page to a number that you can be directly reached. 4. If you still have difficulty reaching the provider, please page the [facility] (Director on Call) for  the Hospitalists listed on amion for assistance.  02/02/2020, 6:14 AM

## 2020-02-02 NOTE — ED Notes (Signed)
Pt d/c home per MD order. Discharge summary reviewed with pt, pt verbalizes understanding. Reports d/c ride home. Off unit via WC - No s/s of distress noted at discharge.

## 2020-02-02 NOTE — ED Notes (Signed)
Pt transported to CT ?

## 2020-02-02 NOTE — ED Notes (Signed)
Cardiology at bedside.

## 2020-02-02 NOTE — Discharge Summary (Signed)
Physician Discharge Summary  Penny Boyer ZYY:[id number] DOB: 16-Mar-1956 DOA: 02/01/2020  PCP: [facility]  Admit date: 02/01/2020 Discharge date: 02/02/2020  Time spent: 50 minutes  Recommendations for Outpatient Follow-up:  1. Follow-up EP cardiology in 1 week for loop recorder  Discharge Diagnoses:  Principal Problem:   Syncope and collapse Active Problems:   Coronary artery disease involving native coronary artery has post PTCA and stenting of the circumflex artery in March 2017 of native heart   Dyslipidemia (high LDL; low HDL)   Hypertension   Chest pain   Discharge Condition: Stable  Diet recommendation: Heart healthy diet  Filed Weights   02/02/20 0514  Weight: 67 kg    History of present illness:  64 year old female with a history of CAD s/p left circumflex stent in 2017, hypertension, hyperlipidemia who has been having recurrent episodes of syncope.  Patient has been followed by cardiology.  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  But patient has been on beta-blocker at that time which has been discontinued by cardiology.  Patient had 2 episodes of dizziness and palpitations but did not pass out on Thursday.  She has Zio AT in place but hospital quit working 2 days ago.  Cardiology is trying to contact the representative from 0 to see if they can extract the data.   Hospital Course:   *Recurrent syncope-unclear etiology.  CT head obtained this morning shows no acute abnormality.    EP cardiology has seen the patient, and recommended loop recorder as outpatient.  Patient can go home, she is cleared by cardiology for discharge.  Hypertension-patient was taking propranolol which was stopped by PCP on 10/18/2019  CAD-continue Ranexa, aspirin.  Hyperlipidemia-patient is on q. 14 days injection of  Praluent  Procedures:    Consultations:  Cardiology  EP cardiology  Discharge Exam: Vitals:   02/02/20 1045 02/02/20 1100  BP: (!) 142/96 140/90  Pulse: 76 81  Resp: 20 20  Temp:    SpO2: 100% 98%    General: Appears in no acute distress Cardiovascular: S1-S2, regular, no murmur auscultated Respiratory: Clear to auscultation bilaterally  Discharge Instructions   Discharge Instructions    Diet - low sodium heart healthy   Complete by: As directed    Increase activity slowly   Complete by: As directed      Allergies as of 02/02/2020      Reactions   Atorvastatin Other (See Comments)   JOINT PAIN   Hydrochlorothiazide W-triamterene Other (See Comments)   UNKNOWN      Medication List    STOP taking these medications   propranolol ER 60 MG 24 hr capsule Commonly known as: INDERAL LA     TAKE these medications   albuterol 108 (90 Base) MCG/ACT inhaler Commonly known as: VENTOLIN HFA Inhale 2 puffs into the lungs every 6 (six) hours as needed for wheezing or shortness of breath.   famotidine-calcium carbonate-magnesium hydroxide 10-800-165 MG chewable tablet Commonly known as: PEPCID COMPLETE Chew 1 tablet by mouth daily as needed (acid reflux).   GoodSense Aspirin 325 MG tablet Generic drug: aspirin Take 325 mg by mouth at bedtime.   ibuprofen 200 MG tablet Commonly known as: ADVIL Take 800 mg by mouth every 6 (six) hours as needed for headache or moderate pain.   isosorbide mononitrate 120 MG 24 hr tablet Commonly known as: IMDUR Take 1  tablet (120 mg total) by mouth daily.   losartan 100 MG tablet Commonly known as: COZAAR Take 1 tablet (100 mg total) by mouth daily.   nitroGLYCERIN 0.4 MG SL tablet Commonly known as: NITROSTAT Place 1 tablet (0.4 mg total) under the tongue as needed for chest pain.   Praluent 75 MG/ML Soaj Generic drug: Alirocumab Inject into the skin every 14 (fourteen) days.   ranolazine 1000 MG SR tablet Commonly  known as: RANEXA Take 1 tablet (1,000 mg total) by mouth 2 (two) times daily.      Allergies  Allergen Reactions  . Atorvastatin Other (See Comments)    JOINT PAIN  . Hydrochlorothiazide W-Triamterene Other (See Comments)    UNKNOWN    Follow-up Information    Vickie Epley, MD Follow up.   Specialty: Cardiology Why: The office will contact you to arrange for loop recorder placement. If you do not hear from them by Tuesday, please call the number provided.  Contact information: [address] [place] [address] [phone]                The results of significant diagnostics from this hospitalization (including imaging, microbiology, ancillary and laboratory) are listed below for reference.    Significant Diagnostic Studies: DG Chest 2 View  Result Date: 02/01/2020 CLINICAL DATA:  Chest pain, Zio heart monitor patch EXAM: CHEST - 2 VIEW COMPARISON:  Chest x-ray 12/13/2018, CT abdomen pelvis 08/01/2005 FINDINGS: Wireless heart monitor overlying the left chest wall. The heart size and mediastinal contours are within normal limits. Well-defined 1 cm round density overlying the left lower lobe likely represents a nipple shadow or granuloma as seen on CT abdomen pelvis 08/01/2005. No focal consolidation. No pulmonary edema. No pleural effusion. No pneumothorax. No acute osseous abnormality. Right upper quadrant surgical clips. IMPRESSION: No active cardiopulmonary disease. Electronically Signed   By: Iven Finn M.D.   On: 02/01/2020 16:35   CT HEAD WO CONTRAST  Result Date: 02/02/2020 CLINICAL DATA:  Dizziness.  Recurrent syncopal episodes. EXAM: CT HEAD WITHOUT CONTRAST TECHNIQUE: Contiguous axial images were obtained from the base of the skull through the vertex without intravenous contrast. COMPARISON:  None. FINDINGS: Brain: No evidence of acute infarction, hemorrhage, hydrocephalus, extra-axial collection or mass lesion/mass effect. Vascular: No  hyperdense vessel or unexpected calcification. Skull: Normal. Negative for fracture or focal lesion. Sinuses/Orbits: No acute finding. Other: None. IMPRESSION: Normal study.  No cause for dizziness or syncope identified. Electronically Signed   By: Dorise Bullion III M.D   On: 02/02/2020 10:25    Microbiology: Recent Results (from the past 240 hour(s))  SARS Coronavirus 2 by RT PCR (hospital order, performed in [facility] hospital lab) Nasopharyngeal Nasopharyngeal Swab     Status: None   Collection Time: 02/02/20  5:40 AM   Specimen: Nasopharyngeal Swab  Result Value Ref Range Status   SARS Coronavirus 2 NEGATIVE NEGATIVE Final    Comment: (NOTE) SARS-CoV-2 target nucleic acids are NOT DETECTED.  The SARS-CoV-2 RNA is generally detectable in upper and lower respiratory specimens during the acute phase of infection. The lowest concentration of SARS-CoV-2 viral copies this assay can detect is 250 copies / mL. A negative result does not preclude SARS-CoV-2 infection and should not be used as the sole basis for treatment or other patient management decisions.  A negative result may occur with improper specimen collection / handling, submission of specimen other than nasopharyngeal swab, presence of viral mutation(s) within the areas targeted  by this assay, and inadequate number of viral copies (<250 copies / mL). A negative result must be combined with clinical observations, patient history, and epidemiological information.  Fact Sheet for Patients:   StrictlyIdeas.no  Fact Sheet for Healthcare Providers: BankingDealers.co.za  This test is not yet approved or  cleared by the [place] FDA and has been authorized for detection and/or diagnosis of SARS-CoV-2 by FDA under an Emergency Use Authorization (EUA).  This EUA will remain in effect (meaning this test can be used) for the duration of the COVID-19 declaration under Section  564(b)(1) of the Act, 21 U.S.C. section 360bbb-3(b)(1), unless the authorization is terminated or revoked sooner.  Performed at [facility], [place] [address] [place], [place] [address]      Labs: Basic Metabolic Panel: Recent Labs  Lab 02/01/20 1558  NA 136  K 4.0  CL 100  CO2 26  GLUCOSE 98  BUN 21  CREATININE 0.94  CALCIUM 9.9   Liver Function Tests: No results for input(s): AST, ALT, ALKPHOS, BILITOT, PROT, ALBUMIN in the last 168 hours. No results for input(s): LIPASE, AMYLASE in the last 168 hours. No results for input(s): AMMONIA in the last 168 hours. CBC: Recent Labs  Lab 02/01/20 1558  WBC 7.7  HGB 14.2  HCT 42.2  MCV 87.0  PLT 284       Signed:  Oswald Hillock MD.  [facility] 02/02/2020, 11:20 AM

## 2020-02-02 NOTE — ED Notes (Signed)
The pt has not had the covid vaccine

## 2020-02-02 NOTE — ED Provider Notes (Signed)
[facility] EMERGENCY DEPARTMENT Provider Note   CSN: [id number] Arrival date & time: 02/01/20  1508     History Chief Complaint  Patient presents with  . Chest Pain    Penny Boyer is a 64 y.o. female.  Patient presents to the emergency department with complaints of chest pain and heart palpitations.  Patient does have history of coronary artery disease.  She had stenting in 2017 and follow-up catheterization showed a diagonal lesion that was not amenable to PTCA that is being treated medically.  Patient reports that she was hospitalized earlier this year after syncopal episode.          Past Medical History:  Diagnosis Date  . Coronary artery disease involving native coronary artery has post PTCA and stenting of the circumflex artery in March 2017 of native heart 07/28/2016   Overview:  PTCA and stent of LCX artery in March 2017 in [place]  . Dyslipidemia (high LDL; low HDL) 07/28/2016  . Hyperlipidemia   . Hypertension   . Precordial pain 07/28/2016    Patient Active Problem List   Diagnosis Date Noted  . Syncope and collapse 10/18/2019  . Kidney stone 10/17/2019  . Abnormal nuclear stress test 12/20/2018  . Hypertension   . Coronary artery disease involving native coronary artery has post PTCA and stenting of the circumflex artery in March 2017 of native heart 07/28/2016  . Dyslipidemia (high LDL; low HDL) 07/28/2016    Past Surgical History:  Procedure Laterality Date  . ABDOMINAL HYSTERECTOMY    . APPENDECTOMY    . CARDIAC CATHETERIZATION    . CHOLECYSTECTOMY    . FOOT SURGERY    . INTRAVASCULAR PRESSURE WIRE/FFR STUDY N/A 12/20/2018   Procedure: INTRAVASCULAR PRESSURE WIRE/FFR STUDY;  Surgeon: Leonie Man, MD;  Location: [facility];  Service: Cardiovascular;  Laterality: N/A;  . LEFT HEART CATH AND CORONARY ANGIOGRAPHY N/A 12/20/2018   Procedure: LEFT HEART CATH AND CORONARY ANGIOGRAPHY;  Surgeon: Leonie Man, MD;   Location: [facility];  Service: Cardiovascular;  Laterality: N/A;  . MANDIBLE FRACTURE SURGERY    . Ulnar Surgery    . WRIST FRACTURE SURGERY       OB History   No obstetric history on file.     Family History  Problem Relation Age of Onset  . Cancer Mother   . Heart disease Father   . Heart disease Brother   . Heart disease Brother     Social History   Tobacco Use  . Smoking status: Former Research scientist (life sciences)  . Smokeless tobacco: Never Used  Vaping Use  . Vaping Use: Never used  Substance Use Topics  . Alcohol use: Yes  . Drug use: No    Home Medications Prior to Admission medications   Medication Sig Start Date End Date Taking? Authorizing Provider  albuterol (VENTOLIN HFA) 108 (90 Base) MCG/ACT inhaler Inhale 2 puffs into the lungs every 6 (six) hours as needed for wheezing or shortness of breath.    [provider]  Alirocumab (PRALUENT) 75 MG/ML SOAJ Inject into the skin every 14 (fourteen) days.    [provider]  aspirin (GOODSENSE ASPIRIN) 325 MG tablet Take 325 mg by mouth at bedtime.     [provider]  famotidine-calcium carbonate-magnesium hydroxide (PEPCID COMPLETE) 10-800-165 MG chewable tablet Chew 1 tablet by mouth daily as needed (acid reflux).    [provider]  ibuprofen (ADVIL) 200 MG tablet Take 800 mg by  mouth every 6 (six) hours as needed for headache or moderate pain.    [provider]  isosorbide mononitrate (IMDUR) 120 MG 24 hr tablet Take 1 tablet (120 mg total) by mouth daily. 05/01/19   Park Liter, MD  losartan (COZAAR) 100 MG tablet Take 1 tablet (100 mg total) by mouth daily. 06/04/19   Park Liter, MD  nitroGLYCERIN (NITROSTAT) 0.4 MG SL tablet Place 1 tablet (0.4 mg total) under the tongue as needed for chest pain. 06/15/19   Park Liter, MD  propranolol ER (INDERAL LA) 60 MG 24 hr capsule Take 1 capsule (60 mg total) by mouth daily. 10/18/19   Park Liter, MD    ranolazine (RANEXA) 1000 MG SR tablet Take 1 tablet (1,000 mg total) by mouth 2 (two) times daily. 11/21/19   Park Liter, MD    Allergies    Atorvastatin and Hydrochlorothiazide w-triamterene  Review of Systems   Review of Systems  Physical Exam Updated Vital Signs BP 129/79 (BP Location: Left Arm)   Pulse 69   Temp 98.2 F (36.8 C) (Oral)   Resp 18   SpO2 99%   Physical Exam  ED Results / Procedures / Treatments   Labs (all labs ordered are listed, but only abnormal results are displayed) Labs Reviewed  BASIC METABOLIC PANEL  CBC  TROPONIN I (HIGH SENSITIVITY)  TROPONIN I (HIGH SENSITIVITY)    EKG EKG Interpretation  Date/Time:  Friday February 01 2020 15:49:53 EDT Ventricular Rate:  82 PR Interval:  150 QRS Duration: 76 QT Interval:  356 QTC Calculation: 415 R Axis:   55 Text Interpretation: Normal sinus rhythm Nonspecific ST abnormality Abnormal ECG Confirmed by Orpah Greek [phone]) on 02/02/2020 4:21:15 AM   Radiology DG Chest 2 View  Result Date: 02/01/2020 CLINICAL DATA:  Chest pain, Zio heart monitor patch EXAM: CHEST - 2 VIEW COMPARISON:  Chest x-ray 12/13/2018, CT abdomen pelvis 08/01/2005 FINDINGS: Wireless heart monitor overlying the left chest wall. The heart size and mediastinal contours are within normal limits. Well-defined 1 cm round density overlying the left lower lobe likely represents a nipple shadow or granuloma as seen on CT abdomen pelvis 08/01/2005. No focal consolidation. No pulmonary edema. No pleural effusion. No pneumothorax. No acute osseous abnormality. Right upper quadrant surgical clips. IMPRESSION: No active cardiopulmonary disease. Electronically Signed   By: Iven Finn M.D.   On: 02/01/2020 16:35    Procedures Procedures (including critical care time)  Medications Ordered in ED Medications - No data to display  ED Course  I have reviewed the triage vital signs and the nursing notes.  Pertinent labs &  imaging results that were available during my care of the patient were reviewed by me and considered in my medical decision making (see chart for details).    MDM Rules/Calculators/A&P                          She has had multiple Zio patches placed recently and has had device failure with multiple.  The first one that was placed by the [facility] reported episodes of 2-1 AV block, but did not repeat subsequent devices have not recorded events so it is not clear what her cardiac rhythm has been with her episodes.  She reports increasing amounts of syncope, passed out at work twice yesterday.  While having her chest pain prior to coming to the ER she was noted to have irregular heartbeat with  tachycardia up to 126 bpm on a pulse ox device but never had a rhythm monitored.  Patient with chest pain earlier tonight in the setting of a known blockage that has not been stented.  This occurred with very minimal exertion which she does not generally experience.  She normally is fairly active without cardiac symptoms.  No obvious ischemia or infarct on EKG, troponins negative.  With her known CAD, however, felt to be high risk, will ask hospitalist to monitor.   Final Clinical Impression(s) / ED Diagnoses Final diagnoses:  Chest pain, unspecified type  CAD in native artery    Rx / DC Orders ED Discharge Orders    None       Orpah Greek, MD 02/02/20 0505

## 2020-02-02 NOTE — Consult Note (Addendum)
Cardiology Consult    Patient ID: Penny Boyer; [id number]; 1955/06/02   Admit date: 02/01/2020 Date of Consult: 02/02/2020  Primary Care Provider: [place] Primary Cardiologist: Jenne Campus, MD  Primary Electrophysiologist: None  Patient Profile    Penny Boyer is a 64 y.o. female with past medical history of CAD (s/p stenting of LCx in 2017, cath in 12/2018 showing patent stent with 60% LAD stenosis and 80% D1 stenosis which was too small for PCI and medical management was recommended, HTN, HLD and recurrent syncopal episodes who is being seen today for the evaluation of syncope at the request of Dr. Alcario Drought.   History of Present Illness    Ms. Large was evaluated by Dr. Agustin Cree in 10/2019 and reported syncopal episodes and her prior monitor from the [place] had demonstrated pauses but the details surrounding this were not available. Inderal was reduced from 120mg  daily to 60mg  daily then with plans to discontinue. At follow-up, it is mentioned in the notes she had "12 episodes of 2-1 AV block lasting a total 35 seconds" per her monitor from the [place]. She had a repeat monitor placed in 11/2019 to assess for recurrence but this was lost in the mail by review of notes. Was recommended she had a repeat monitor at the time of her visit on 01/22/2020 but experienced technical issues with the monitor and was issued a new one this past week.  She presented to [facility] ED this morning for evaluation of chest pain and recurrent syncopal episodes. In talking with the patient today, she reports having approximately 15-18 syncopal episodes over the past year which has led to her wearing monitors as outlined above. On Thursday, she experienced 1 syncopal episode while at work and was eating lunch at the time. Says she felt dizzy and like the room was spinning then lost consciousness. Says she was only out for a few seconds but experienced nausea and vomiting after her episode. She had  recurrent presyncope later that day but denies any loss of consciousness. Yesterday afternoon she experienced palpitations and chest discomfort. Says she felt like her heart was racing with an associated chest pressure at that time. She did check her heart rate and it was variable from the 110's to 120's and due to concerns for possible atrial fibrillation, she came to the ED for further evaluation as she had been informed her Zio patch was not sending transmissions. She denies any recurrent symptoms overnight or this morning. No recent orthopnea, PND or lower extremity edema. Reports her father had CAD but she is unaware of any family members requiring PPM placement.  Initial labs show WBC 7.7, Hgb 14.2, platelets 284, Na+ 136, K+ 4.0 and creatinine 0.94. Initial HS Troponin 3 with repeat < 2. COVID negative. CXR showed no active cardiopulmonary disease. EKG shows NSR, HR 82 with baseline artifact but no distinct ST abnormalities when compared to prior tracings. Repeat ordered but not yet performed.    Past Medical History:  Diagnosis Date  . Coronary artery disease involving native coronary artery has post PTCA and stenting of the circumflex artery in March 2017 of native heart 07/28/2016   Overview:  PTCA and stent of LCX artery in March 2017 in [place]  . Dyslipidemia (high LDL; low HDL) 07/28/2016  . Hyperlipidemia   . Hypertension   . Precordial pain 07/28/2016    Past Surgical History:  Procedure Laterality Date  . ABDOMINAL HYSTERECTOMY    . APPENDECTOMY    .  CARDIAC CATHETERIZATION    . CHOLECYSTECTOMY    . FOOT SURGERY    . INTRAVASCULAR PRESSURE WIRE/FFR STUDY N/A 12/20/2018   Procedure: INTRAVASCULAR PRESSURE WIRE/FFR STUDY;  Surgeon: Leonie Man, MD;  Location: [facility];  Service: Cardiovascular;  Laterality: N/A;  . LEFT HEART CATH AND CORONARY ANGIOGRAPHY N/A 12/20/2018   Procedure: LEFT HEART CATH AND CORONARY ANGIOGRAPHY;  Surgeon: Leonie Man, MD;  Location:  [facility];  Service: Cardiovascular;  Laterality: N/A;  . MANDIBLE FRACTURE SURGERY    . Ulnar Surgery    . WRIST FRACTURE SURGERY       Home Medications:  Prior to Admission medications   Medication Sig Start Date End Date Taking? Authorizing Provider  albuterol (VENTOLIN HFA) 108 (90 Base) MCG/ACT inhaler Inhale 2 puffs into the lungs every 6 (six) hours as needed for wheezing or shortness of breath.   Yes [provider]  Alirocumab (PRALUENT) 75 MG/ML SOAJ Inject into the skin every 14 (fourteen) days.   Yes [provider]  aspirin (GOODSENSE ASPIRIN) 325 MG tablet Take 325 mg by mouth at bedtime.    Yes [provider]  famotidine-calcium carbonate-magnesium hydroxide (PEPCID COMPLETE) 10-800-165 MG chewable tablet Chew 1 tablet by mouth daily as needed (acid reflux).   Yes [provider]  ibuprofen (ADVIL) 200 MG tablet Take 800 mg by mouth every 6 (six) hours as needed for headache or moderate pain.   Yes [provider]  isosorbide mononitrate (IMDUR) 120 MG 24 hr tablet Take 1 tablet (120 mg total) by mouth daily. 05/01/19  Yes Park Liter, MD  losartan (COZAAR) 100 MG tablet Take 1 tablet (100 mg total) by mouth daily. 06/04/19  Yes Park Liter, MD  nitroGLYCERIN (NITROSTAT) 0.4 MG SL tablet Place 1 tablet (0.4 mg total) under the tongue as needed for chest pain. 06/15/19  Yes Park Liter, MD  ranolazine (RANEXA) 1000 MG SR tablet Take 1 tablet (1,000 mg total) by mouth 2 (two) times daily. 11/21/19  Yes Park Liter, MD  propranolol ER (INDERAL LA) 60 MG 24 hr capsule Take 1 capsule (60 mg total) by mouth daily. Patient not taking: Reported on 02/02/2020 10/18/19   Park Liter, MD    Inpatient Medications: Scheduled Meds: . aspirin  325 mg Oral QHS  . enoxaparin (LOVENOX) injection  40 mg Subcutaneous Q24H  . ranolazine  1,000 mg Oral BID   Continuous Infusions:  PRN Meds: acetaminophen,  ibuprofen, ondansetron (ZOFRAN) IV  Allergies:    Allergies  Allergen Reactions  . Atorvastatin Other (See Comments)    JOINT PAIN  . Hydrochlorothiazide W-Triamterene Other (See Comments)    UNKNOWN    Social History:   Social History   Socioeconomic History  . Marital status: Married    Spouse name: Not on file  . Number of children: Not on file  . Years of education: Not on file  . Highest education level: Not on file  Occupational History  . Not on file  Tobacco Use  . Smoking status: Former Research scientist (life sciences)  . Smokeless tobacco: Never Used  Vaping Use  . Vaping Use: Never used  Substance and Sexual Activity  . Alcohol use: Yes  . Drug use: No  . Sexual activity: Not on file  Other Topics Concern  . Not on file  Social History Narrative  . Not on file   Social Determinants of Health   Financial Resource Strain:   .  Difficulty of Paying Living Expenses: Not on file  Food Insecurity:   . Worried About Charity fundraiser in the Last Year: Not on file  . Ran Out of Food in the Last Year: Not on file  Transportation Needs:   . Lack of Transportation (Medical): Not on file  . Lack of Transportation (Non-Medical): Not on file  Physical Activity:   . Days of Exercise per Week: Not on file  . Minutes of Exercise per Session: Not on file  Stress:   . Feeling of Stress : Not on file  Social Connections:   . Frequency of Communication with Friends and Family: Not on file  . Frequency of Social Gatherings with Friends and Family: Not on file  . Attends Religious Services: Not on file  . Active Member of Clubs or Organizations: Not on file  . Attends Archivist Meetings: Not on file  . Marital Status: Not on file  Intimate Partner Violence:   . Fear of Current or Ex-Partner: Not on file  . Emotionally Abused: Not on file  . Physically Abused: Not on file  . Sexually Abused: Not on file     Family History:    Family History  Problem Relation Age of Onset  .  Cancer Mother   . Heart disease Father   . Heart disease Brother   . Heart disease Brother       Review of Systems    General:  No chills, fever, night sweats or weight changes. Positive for syncope.  Cardiovascular:  No dyspnea on exertion, edema, orthopnea, palpitations, paroxysmal nocturnal dyspnea. Positive for palpitations.  Dermatological: No rash, lesions/masses Respiratory: No cough, dyspnea Urologic: No hematuria, dysuria Abdominal:   No nausea, vomiting, diarrhea, bright red blood per rectum, melena, or hematemesis Neurologic:  No visual changes, wkns, changes in mental status. All other systems reviewed and are otherwise negative except as noted above.  Physical Exam/Data    Vitals:   02/02/20 0530 02/02/20 0600 02/02/20 0630 02/02/20 0700  BP: (!) 140/101 (!) 147/88 (!) 141/83 133/88  Pulse: 71 89 70 87  Resp: 11 (!) 21 14 19   Temp:      TempSrc:      SpO2: 99% 96% 100% 98%  Weight:      Height:       No intake or output data in the 24 hours ending 02/02/20 0730 Filed Weights   02/02/20 0514  Weight: 67 kg   Body mass index is 25.35 kg/m.   General: Pleasant female appearing in NAD Psych: Normal affect. Neuro: Alert and oriented X 3. Moves all extremities spontaneously. HEENT: Normal  Neck: Supple without bruits or JVD. Lungs:  Resp regular and unlabored, CTA without wheezing or rales. Heart: RRR no s3, s4, or murmurs. Abdomen: Soft, non-tender, non-distended, BS + x 4.  Extremities: No clubbing, cyanosis or lower extremity edema. DP/PT/Radials 2+ and equal bilaterally.   EKG:  The EKG was personally reviewed and demonstrates: NSR, HR 82 with baseline artifact but no distinct ST abnormalities when compared to prior tracings.   Telemetry:  Telemetry was personally reviewed and demonstrates: NSR, HR in 70's to 80's. Limited tele review available as prior data unavailable due to her moving rooms. No alarms saved.    Labs/Studies     Relevant CV  Studies:  Cardiac Catheterization: 12/2018 personally reviewed  Mid LAD lesion is 60% stenosed - "ostial" lesion @ 2nd Diag  1st Diag lesion is 80% stenosed. Very small  vessel - NOT PCI/PTCA target  Prox Cx to Mid Cx Stent is 5% stenosed.  Dist RCA lesion is 20% stenosed. RPDA lesion is 55% stenosed. DOMINANT RCA  --------------------------------  The left ventricular systolic function is normal. The left ventricular ejection fraction is 55-65% by visual estimate.  LV end diastolic pressure is mildly elevated.   SUMMARY  Moderate two-vessel disease with FFR Negative 60% lesion in LAD at D2, distal RCA-RPDA 55% with widely patent stent in proximal LCx.  Normal LVEF with mildly elevated LVEDP.  Likely false positive stress test   Echocardiogram: 08/2019   Carotid Dopplers: 08/2019    Laboratory Data:  Chemistry Recent Labs  Lab 02/01/20 1558  NA 136  K 4.0  CL 100  CO2 26  GLUCOSE 98  BUN 21  CREATININE 0.94  CALCIUM 9.9  GFRNONAA >60  GFRAA >60  ANIONGAP 10    No results for input(s): PROT, ALBUMIN, AST, ALT, ALKPHOS, BILITOT in the last 168 hours. Hematology Recent Labs  Lab 02/01/20 1558  WBC 7.7  RBC 4.85  HGB 14.2  HCT 42.2  MCV 87.0  MCH 29.3  MCHC 33.6  RDW 12.6  PLT 284   Cardiac EnzymesNo results for input(s): TROPONINI in the last 168 hours. No results for input(s): TROPIPOC in the last 168 hours.  BNPNo results for input(s): BNP, PROBNP in the last 168 hours.  DDimer No results for input(s): DDIMER in the last 168 hours.  Radiology/Studies:  DG Chest 2 View  Result Date: 02/01/2020 CLINICAL DATA:  Chest pain, Zio heart monitor patch EXAM: CHEST - 2 VIEW COMPARISON:  Chest x-ray 12/13/2018, CT abdomen pelvis 08/01/2005 FINDINGS: Wireless heart monitor overlying the left chest wall. The heart size and mediastinal contours are within normal limits. Well-defined 1 cm round density overlying the left lower lobe likely represents a nipple  shadow or granuloma as seen on CT abdomen pelvis 08/01/2005. No focal consolidation. No pulmonary edema. No pleural effusion. No pneumothorax. No acute osseous abnormality. Right upper quadrant surgical clips. IMPRESSION: No active cardiopulmonary disease. Electronically Signed   By: Iven Finn M.D.   On: 02/01/2020 16:35     Assessment & Plan    1. Recurrent Syncopal Episodes - She reports 15+ syncopal episodes in the past year and prior monitor ordered by the [place] showed 12 episodes of 2-1 AV block lasting a total 35 seconds but she was on a BB at that time which has since been discontinued. She had a recurrent syncopal episode on Thursday with associated dizziness, nausea and vomiting. Experienced tachycardia with associated chest pressure and palpitations yesterday which spontaneously resolved.  - Limited tele data available given room changes in the ED but no saved alarms for arrhythmias. Initial HS Troponin 3 with repeat < 2. COVID negative. EKG shows NSR, HR 82 with baseline artifact but no distinct ST abnormalities when compared to prior tracings. Will order a repeat for this AM. Recent echocardiogram earlier this year showed no significant structural abnormalities and carotid dopplers without significant stenosis.  - She does have a Zio AT in place but her transmitter quit working two days ago. I have been in touch with our Zio representative and they will plan to have her monitor expedited once sent back. Will also see if a family member can bring her transmitter to the ED as they may be able to access it while here. Continue to follow on telemetry. Given her difficulty with external monitors and recurrent syncopal episodes, would consider EP  referral for a possible ILR as an outpatient.   2. CAD - She is s/p stenting of LCx in 2017 with recent cath in 12/2018 showing patent stent with 60% LAD stenosis and 80% D1 stenosis which was too small for PCI and medical management was recommended as  outlined above.  - She did report episodes of chest pressure when she was experiencing palpitations but denies any recent exertional chest pain. HS Troponin values negative. Will order a repeat 12-Lead EKG given artifact on initial tracing. - Continue ASA 81mg  daily. Will reorder PTA Imdur and Ranexa. No longer on a BB given issues with bradycardia. Statin intolerant and on Praluent as an outpatient.   3. HTN - BP has been variable from 129/79 - 157/93 within the past 24 hours. Continue PTA Losartan 100mg  daily.   4. [facility] to 182 in 04/2019 and PCSK-9 inhibitor therapy has been started in the interim. Will add FLP to AM labs.     For questions or updates, please contact [place] Please consult [email or web address] for contact info under Cardiology/STEMI.  Signed, Erma Heritage, PA-C 02/02/2020, 7:30 AM Pager: [phone]    ----------------------------------------------------------------------------------  I have seen, examined the patient, and reviewed the above assessment and plan.    Ms Zaffino is a 64yo woman with hx of CAD s/p LCx stent in 2017 who presented to the ER with recurrent syncopal episodes. She tells me that these have been going on for at least the past year with 15+ episodes. She had 2 episodes on Thursday that were associated with N/V. She had another episode on Friday while at work. She checked her HR on Friday after the episode and it was faster than normal (110-120) and irregular. She tried to send a transmission from the [facility] but was told the [facility] was not sending data so she presented to the ER to be evaluated for possible AF.   Most of her episodes have a prodrome that lasts several seconds and is always a feeling of intense lightheadedness/dizziness. No feelings of a fast HR or skipped beats during the prodrome. No history of AF.  She has worn at least 4 monitors but these have been inconclusive/nondiagnostic.   The  differential for these episodes is broad and includes fast (SVT, VT) and slow (AV conduction abnormalities, post conversion pauses, vagally mediated heart block) rhythms in addition to diagnoses unrelated to cardiac arrhythmias.   I would like to see Ms Visscher in clinic this week to place a loop recorder. This will give [place] reliable, uninterrupted, information regarding her heart rhythm and will give her the ability to mark moments in time that she experiences lightheadedness. I have discussed the procedure with her and she is agreeable to proceed.  I have advised Ms Hepler to avoid operating a car given the unclear etiology for her passing out spells and she expressed understanding.  Lysbeth Galas T. Quentin Ore, MD, [facility] Cardiac Electrophysiology

## 2020-02-02 NOTE — Discharge Instructions (Signed)
Do not drive, operate heavy machinery, climb ladders until cleared by cardiology.

## 2020-02-05 ENCOUNTER — Ambulatory Visit (INDEPENDENT_AMBULATORY_CARE_PROVIDER_SITE_OTHER): Payer: No Typology Code available for payment source | Admitting: Cardiology

## 2020-02-05 ENCOUNTER — Other Ambulatory Visit: Payer: Self-pay

## 2020-02-05 ENCOUNTER — Encounter: Payer: Self-pay | Admitting: Cardiology

## 2020-02-05 VITALS — BP 130/72 | HR 84 | Ht 64.0 in | Wt 149.0 lb

## 2020-02-05 DIAGNOSIS — R55 Syncope and collapse: Secondary | ICD-10-CM

## 2020-02-05 NOTE — Patient Instructions (Addendum)
Medication Instructions:  Your physician recommends that you continue on your current medications as directed. Please refer to the Current Medication list given to you today.  Labwork: None ordered.  Testing/Procedures: None ordered.  Follow-Up:  Your physician wants you to follow-up in: 3 months with Dr. Quentin Ore.   May 06, 2020 at 1:45 pm at the [facility] office   Implantable Loop Recorder Placement, Care After This sheet gives you information about how to care for yourself after your procedure. Your health care provider may also give you more specific instructions. If you have problems or questions, contact your health care provider. What can I expect after the procedure? After the procedure, it is common to have:  Soreness or discomfort near the incision.  Some swelling or bruising near the incision.  Follow these instructions at home: Incision care  1. Leave your outer dressing on for 72 hours. Do NOT shower for 5 days. 2. Leave adhesive strips in place. These skin closures may need to stay in place for 1-2 weeks. If adhesive strip edges start to loosen and curl up, you may trim the loose edges.  You may remove the strips if they have not fallen off after 2 weeks. 3. Check your incision area every day for signs of infection. Check for: a. Redness, swelling, or pain. b. Fluid or blood. c. Warmth. d. Pus or a bad smell. 4. Do not take baths, swim, or use a hot tub until your incision is completely healed. 5. If your wound site starts to bleed apply pressure.      If you have any questions/concerns please call the device clinic at [phone].  Activity  Return to your normal activities.  General instructions  Follow instructions from your health care provider about how to manage your implantable loop recorder and transmit the information. Learn how to activate a recording if this is necessary for your type of device.  Do not go through a metal detection gate,  and do not let someone hold a metal detector over your chest. Show your ID card.  Do not have an MRI unless you check with your health care provider first.  Take over-the-counter and prescription medicines only as told by your health care provider.  Keep all follow-up visits as told by your health care provider. This is important. Contact a health care provider if:  You have redness, swelling, or pain around your incision.  You have a fever.  You have pain that is not relieved by your pain medicine.  You have triggered your device because of fainting (syncope) or because of a heartbeat that feels like it is racing, slow, fluttering, or skipping (palpitations). Get help right away if you have:  Chest pain.  Difficulty breathing. Summary  After the procedure, it is common to have soreness or discomfort near the incision.  Change your dressing as told by your health care provider.  Follow instructions from your health care provider about how to manage your implantable loop recorder and transmit the information.  Keep all follow-up visits as told by your health care provider. This is important. This information is not intended to replace advice given to you by your health care provider. Make sure you discuss any questions you have with your health care provider. Document Released: 04/14/2015 Document Revised: 06/18/2017 Document Reviewed: 06/18/2017 Elsevier Patient Education  2020 [employer].

## 2020-02-05 NOTE — Progress Notes (Signed)
Electrophysiology Office Note:    Date:  02/05/2020   ID:  Penny Boyer, DOB 11/02/1955, MRN [id number]  PCP:  [place] Cardiologist:  Jenne Campus, MD  [place] Electrophysiologist:  Vickie Epley, MD   Referring MD: Elsa   Chief Complaint: Syncope  History of Present Illness:    Penny Boyer is a 64 y.o. female who presents for evaluation of syncope. She was last seen in the emergency department last week after experiencing 3 syncopal episodes in the last week. She has worn 5 zio monitors that have been unrevealing or have not worked. She presents today for loop recorder implant. She tells me that she has not experienced a syncopal episode since leaving the emergency department.  Past Medical History:  Diagnosis Date  . Coronary artery disease involving native coronary artery has post PTCA and stenting of the circumflex artery in March 2017 of native heart 07/28/2016   Overview:  PTCA and stent of LCX artery in March 2017 in [place]  . Dyslipidemia (high LDL; low HDL) 07/28/2016  . Hyperlipidemia   . Hypertension   . Precordial pain 07/28/2016    Past Surgical History:  Procedure Laterality Date  . ABDOMINAL HYSTERECTOMY    . APPENDECTOMY    . CARDIAC CATHETERIZATION    . CHOLECYSTECTOMY    . FOOT SURGERY    . INTRAVASCULAR PRESSURE WIRE/FFR STUDY N/A 12/20/2018   Procedure: INTRAVASCULAR PRESSURE WIRE/FFR STUDY;  Surgeon: Leonie Man, MD;  Location: [facility];  Service: Cardiovascular;  Laterality: N/A;  . LEFT HEART CATH AND CORONARY ANGIOGRAPHY N/A 12/20/2018   Procedure: LEFT HEART CATH AND CORONARY ANGIOGRAPHY;  Surgeon: Leonie Man, MD;  Location: [facility];  Service: Cardiovascular;  Laterality: N/A;  . MANDIBLE FRACTURE SURGERY    . Ulnar Surgery    . WRIST FRACTURE SURGERY      Current Medications: Current Meds  Medication Sig  . albuterol (VENTOLIN HFA) 108 (90 Base)  MCG/ACT inhaler Inhale 2 puffs into the lungs every 6 (six) hours as needed for wheezing or shortness of breath.  . Alirocumab (PRALUENT) 75 MG/ML SOAJ Inject into the skin every 14 (fourteen) days.  [name] aspirin (GOODSENSE ASPIRIN) 325 MG tablet Take 325 mg by mouth at bedtime.   . famotidine-calcium carbonate-magnesium hydroxide (PEPCID COMPLETE) 10-800-165 MG chewable tablet Chew 1 tablet by mouth daily as needed (acid reflux).  [name] ibuprofen (ADVIL) 200 MG tablet Take 800 mg by mouth every 6 (six) hours as needed for headache or moderate pain.  . isosorbide mononitrate (IMDUR) 120 MG 24 hr tablet Take 1 tablet (120 mg total) by mouth daily.  [name] losartan (COZAAR) 100 MG tablet Take 1 tablet (100 mg total) by mouth daily.  . nitroGLYCERIN (NITROSTAT) 0.4 MG SL tablet Place 1 tablet (0.4 mg total) under the tongue as needed for chest pain.  . ranolazine (RANEXA) 1000 MG SR tablet Take 1 tablet (1,000 mg total) by mouth 2 (two) times daily.     Allergies:   Atorvastatin and Hydrochlorothiazide w-triamterene   Social History   Socioeconomic History  . Marital status: Married    Spouse name: Not on file  . Number of children: Not on file  . Years of education: Not on file  . Highest education level: Not on file  Occupational History  . Not on file  Tobacco Use  . Smoking status: Former Research scientist (life sciences)  . Smokeless tobacco: Never Used  Vaping Use  . Vaping Use: Never used  Substance and Sexual Activity  . Alcohol use: Yes  . Drug use: No  . Sexual activity: Not on file  Other Topics Concern  . Not on file  Social History Narrative  . Not on file   Social Determinants of Health   Financial Resource Strain:   . Difficulty of Paying Living Expenses: Not on file  Food Insecurity:   . Worried About Charity fundraiser in the Last Year: Not on file  . Ran Out of Food in the Last Year: Not on file  Transportation Needs:   . Lack of Transportation (Medical): Not on file  . Lack of Transportation  (Non-Medical): Not on file  Physical Activity:   . Days of Exercise per Week: Not on file  . Minutes of Exercise per Session: Not on file  Stress:   . Feeling of Stress : Not on file  Social Connections:   . Frequency of Communication with Friends and Family: Not on file  . Frequency of Social Gatherings with Friends and Family: Not on file  . Attends Religious Services: Not on file  . Active Member of Clubs or Organizations: Not on file  . Attends Archivist Meetings: Not on file  . Marital Status: Not on file     Family History: The patient's family history includes Cancer in her mother; Heart disease in her brother, brother, and father.  ROS:   Please see the history of present illness.    All other systems reviewed and are negative.  EKGs/Labs/Other Studies Reviewed:     EKG:  The ekg ordered today demonstrates sinus rhythm.  Recent Labs: 02/01/2020: BUN 21; Creatinine, Ser 0.94; Hemoglobin 14.2; Platelets 284; Potassium 4.0; Sodium 136 02/02/2020: TSH 2.663  Recent Lipid Panel    Component Value Date/Time   CHOL 269 (H) 02/02/2020 1010   CHOL 267 (H) 05/01/2019 1124   TRIG 133 02/02/2020 1010   HDL 59 02/02/2020 1010   HDL 64 05/01/2019 1124   CHOLHDL 4.6 02/02/2020 1010   VLDL 27 02/02/2020 1010   LDLCALC 183 (H) 02/02/2020 1010   LDLCALC 182 (H) 05/01/2019 1124    Physical Exam:    VS:  BP 130/72   Pulse 84   Ht 5\' 4"  (1.626 m)   Wt 149 lb (67.6 kg)   SpO2 91%   BMI 25.58 kg/m     Wt Readings from Last 3 Encounters:  02/05/20 149 lb (67.6 kg)  02/02/20 147 lb 11.3 oz (67 kg)  01/22/20 147 lb 12.8 oz (67 kg)     GEN:  Well nourished, well developed in no acute distress HEENT: Normal NECK: No JVD; No carotid bruits LYMPHATICS: No lymphadenopathy CARDIAC: RRR, no murmurs, rubs, gallops RESPIRATORY:  Clear to auscultation without rales, wheezing or rhonchi  ABDOMEN: Soft, non-tender, non-distended MUSCULOSKELETAL:  No edema; No deformity    SKIN: Warm and dry NEUROLOGIC:  Alert and oriented x 3 PSYCHIATRIC:  Normal affect   ASSESSMENT:    1. Syncope and collapse    PLAN:    In order of problems listed above:  1. Syncope Unclear etiology. Zio monitors have been unrevealing thus far. Plan for loop recorder implant today. Risks, benefits and alternatives were discussed and she wishes to proceed. Consent obtained. Given the etiology of her syncopal episodes is unclear, I have advised Ms Frech to avoid operating a motor vehicle.   Medication Adjustments/Labs and Tests Ordered: Current medicines are  reviewed at length with the patient today.  Concerns regarding medicines are outlined above.  No orders of the defined types were placed in this encounter.  No orders of the defined types were placed in this encounter.    Signed, Lars Mage, MD, [facility]  02/05/2020 2:48 PM    Electrophysiology [facility]        SURGEON:  Lars Mage, MD    PREPROCEDURE DIAGNOSIS:  Syncope    POSTPROCEDURE DIAGNOSIS:  Syncope     PROCEDURES:   1. Implantable loop recorder implantation    INTRODUCTION:  Penny Boyer is a 64 y.o. female with syncope who presents today for implantable loop implantation.      DESCRIPTION OF PROCEDURE:  Informed written consent was obtained.  The patient required no sedation for the procedure today.   The patients left chest was therefore prepped and draped in the usual sterile fashion. The skin overlying the left parasternal region was infiltrated with lidocaine for local analgesia.  A 0.5-cm incision was made over the left parasternal region over the 3rd intercostal space.  A Medtronic Reveal Linq model [phone]) implantable loop recorder was then placed into the pocket  R waves were very prominent and measured >0.28mV.  Steri- Strips and a sterile dressing were then applied.  There were no early apparent complications.     CONCLUSIONS:   1. Successful  implantation of a Medtronic Reveal LINQ implantable loop recorder for syncope.  2. No early apparent complications.   Lars Mage, MD 02/05/2020 2:49 PM

## 2020-02-06 NOTE — Addendum Note (Signed)
Addended by: Rose Phi on: 02/06/2020 01:59 PM   Modules accepted: Orders

## 2020-02-11 ENCOUNTER — Institutional Professional Consult (permissible substitution): Payer: No Typology Code available for payment source | Admitting: Cardiology

## 2020-02-12 ENCOUNTER — Telehealth: Payer: Self-pay

## 2020-02-12 NOTE — Telephone Encounter (Signed)
Patient called in and wants to know if I can ask one of the medtronic reps if they have a cord for her monitor. She spoke to [facility] and they said they are on back order. I let patient know I will get back with her

## 2020-02-14 NOTE — Telephone Encounter (Signed)
Spoke with the patient and the company was able to send her a whole new unit so everything is working fine

## 2020-03-06 ENCOUNTER — Telehealth: Payer: Self-pay | Admitting: Cardiology

## 2020-03-06 MED ORDER — ISOSORBIDE MONONITRATE ER 120 MG PO TB24
120.0000 mg | ORAL_TABLET | Freq: Every day | ORAL | 1 refills | Status: DC
Start: 2020-03-06 — End: 2020-08-12

## 2020-03-06 MED ORDER — LOSARTAN POTASSIUM 100 MG PO TABS
100.0000 mg | ORAL_TABLET | Freq: Every day | ORAL | 1 refills | Status: DC
Start: 2020-03-06 — End: 2020-08-12

## 2020-03-06 NOTE — Telephone Encounter (Signed)
*  STAT* If patient is at the pharmacy, call can be transferred to refill team.   1. Which medications need to be refilled? (please list name of each medication and dose if known)  isosorbide mononitrate (IMDUR) 120 MG 24 hr tablet [[id number]] losartan (COZAAR) 100 MG tablet [[id number]]    2. Which pharmacy/location (including street and city if local pharmacy) is medication to be sent to?  [facility], [address] [place] [address]  Phone:  [phone] Fax:  [phone]   3. Do they need a 30 day or 90 day supply? [place]

## 2020-03-06 NOTE — Telephone Encounter (Signed)
Medications filled.  

## 2020-03-17 ENCOUNTER — Ambulatory Visit (INDEPENDENT_AMBULATORY_CARE_PROVIDER_SITE_OTHER): Payer: No Typology Code available for payment source

## 2020-03-17 DIAGNOSIS — R55 Syncope and collapse: Secondary | ICD-10-CM | POA: Diagnosis not present

## 2020-03-17 LAB — CUP PACEART REMOTE DEVICE CHECK
Date Time Interrogation Session: 20211101120002
Implantable Pulse Generator Implant Date: 20210921

## 2020-03-20 NOTE — Progress Notes (Signed)
[facility] Summary Report / Loop Recorder 

## 2020-03-31 ENCOUNTER — Other Ambulatory Visit: Payer: Self-pay

## 2020-03-31 ENCOUNTER — Encounter: Payer: Self-pay | Admitting: Cardiology

## 2020-03-31 ENCOUNTER — Ambulatory Visit (INDEPENDENT_AMBULATORY_CARE_PROVIDER_SITE_OTHER): Payer: No Typology Code available for payment source | Admitting: Cardiology

## 2020-03-31 VITALS — BP 130/90 | HR 77 | Ht 60.0 in | Wt 153.0 lb

## 2020-03-31 DIAGNOSIS — E785 Hyperlipidemia, unspecified: Secondary | ICD-10-CM | POA: Diagnosis not present

## 2020-03-31 DIAGNOSIS — R251 Tremor, unspecified: Secondary | ICD-10-CM

## 2020-03-31 DIAGNOSIS — R55 Syncope and collapse: Secondary | ICD-10-CM

## 2020-03-31 DIAGNOSIS — I2510 Atherosclerotic heart disease of native coronary artery without angina pectoris: Secondary | ICD-10-CM

## 2020-03-31 DIAGNOSIS — I10 Essential (primary) hypertension: Secondary | ICD-10-CM

## 2020-03-31 NOTE — Patient Instructions (Signed)
Medication Instructions:  Your physician recommends that you continue on your current medications as directed. Please refer to the Current Medication list given to you today.  *If you need a refill on your cardiac medications before your next appointment, please call your pharmacy*   Lab Work: None. If you have labs (blood work) drawn today and your tests are completely normal, you will receive your results only by: [name] MyChart Message (if you have MyChart) OR . A paper copy in the mail If you have any lab test that is abnormal or we need to change your treatment, we will call you to review the results.   Testing/Procedures: none   Follow-Up: At [facility], you and your health needs are our priority.  As part of our continuing mission to provide you with exceptional heart care, we have created designated Provider Care Teams.  These Care Teams include your primary Cardiologist (physician) and Advanced Practice Providers (APPs -  Physician Assistants and Nurse Practitioners) who all work together to provide you with the care you need, when you need it.  We recommend signing up for the patient portal called "MyChart".  Sign up information is provided on this After Visit Summary.  MyChart is used to connect with patients for Virtual Visits (Telemedicine).  Patients are able to view lab/test results, encounter notes, upcoming appointments, etc.  Non-urgent messages can be sent to your provider as well.   To learn more about what you can do with MyChart, go to [email or web address].    Your next appointment:   4 month(s)  The format for your next appointment:   In Person  Provider:   Jenne Campus, MD   Other Instructions

## 2020-03-31 NOTE — Progress Notes (Signed)
a 

## 2020-03-31 NOTE — Progress Notes (Signed)
Cardiology Office Note:    Date:  03/31/2020   ID:  Penny Boyer, DOB June 18, 1955, MRN [id number]  PCP:  [place]  Cardiologist:  Jenne Campus, MD    Referring MD: [place]   Chief Complaint  Patient presents with  . Shortness of Breath  . Dizziness  . Follow-up    History of Present Illness:    Penny Boyer is a 64 y.o. female with past medical history significant for coronary artery disease.She did have a cardiac catheterization in 2017 with PTCA and stenting of the circumflex artery done in [place]. After thatShe had a stress test done which showed ischemia in [place] cardiac catheterization after that showed diagonal branch disease and decision has been made to pursue medical therapy Recent she got implantable loop recorder inserted.  So far no diagnostic finding on the monitor.  She is very frustrated with the situation she still described to have some episode of dizziness that happens at any time make her feel uncomfortable on top of that she complain of being weak tired and exhausted.  She said she is always very energetic person however lately she cannot perform as good as she normally does.  She also tells me that she works all the time she is on the go all the time.  Past Medical History:  Diagnosis Date  . Coronary artery disease involving native coronary artery has post PTCA and stenting of the circumflex artery in March 2017 of native heart 07/28/2016   Overview:  PTCA and stent of LCX artery in March 2017 in [place]  . Dyslipidemia (high LDL; low HDL) 07/28/2016  . Hyperlipidemia   . Hypertension   . Precordial pain 07/28/2016    Past Surgical History:  Procedure Laterality Date  . ABDOMINAL HYSTERECTOMY    . APPENDECTOMY    . CARDIAC CATHETERIZATION    . CHOLECYSTECTOMY    . FOOT SURGERY    . INTRAVASCULAR PRESSURE WIRE/FFR STUDY N/A 12/20/2018   Procedure: INTRAVASCULAR PRESSURE WIRE/FFR STUDY;  Surgeon: Leonie Man, MD;  Location: [facility];  Service: Cardiovascular;  Laterality: N/A;  . LEFT HEART CATH AND CORONARY ANGIOGRAPHY N/A 12/20/2018   Procedure: LEFT HEART CATH AND CORONARY ANGIOGRAPHY;  Surgeon: Leonie Man, MD;  Location: [facility];  Service: Cardiovascular;  Laterality: N/A;  . MANDIBLE FRACTURE SURGERY    . Ulnar Surgery    . WRIST FRACTURE SURGERY      Current Medications: Current Meds  Medication Sig  . albuterol (VENTOLIN HFA) 108 (90 Base) MCG/ACT inhaler Inhale 2 puffs into the lungs every 6 (six) hours as needed for wheezing or shortness of breath.  . Alirocumab (PRALUENT) 75 MG/ML SOAJ Inject into the skin every 14 (fourteen) days.  [name] aspirin (GOODSENSE ASPIRIN) 325 MG tablet Take 325 mg by mouth at bedtime.   . famotidine-calcium carbonate-magnesium hydroxide (PEPCID COMPLETE) 10-800-165 MG chewable tablet Chew 1 tablet by mouth daily as needed (acid reflux).  [name] ibuprofen (ADVIL) 200 MG tablet Take 800 mg by mouth every 6 (six) hours as needed for headache or moderate pain.  . isosorbide mononitrate (IMDUR) 120 MG 24 hr tablet Take 1 tablet (120 mg total) by mouth daily.  [name] losartan (COZAAR) 100 MG tablet Take 1 tablet (100 mg total) by mouth daily.  . nitroGLYCERIN (NITROSTAT) 0.4 MG SL tablet Place 1 tablet (0.4 mg total) under the tongue as needed for chest pain.  . ranolazine (RANEXA) 1000 MG  SR tablet Take 1 tablet (1,000 mg total) by mouth 2 (two) times daily.     Allergies:   Atorvastatin and Hydrochlorothiazide w-triamterene   Social History   Socioeconomic History  . Marital status: Married    Spouse name: Not on file  . Number of children: Not on file  . Years of education: Not on file  . Highest education level: Not on file  Occupational History  . Not on file  Tobacco Use  . Smoking status: Former Research scientist (life sciences)  . Smokeless tobacco: Never Used  Vaping Use  . Vaping Use: Never used  Substance and Sexual Activity  . Alcohol use: Yes    . Drug use: No  . Sexual activity: Not on file  Other Topics Concern  . Not on file  Social History Narrative  . Not on file   Social Determinants of Health   Financial Resource Strain:   . Difficulty of Paying Living Expenses: Not on file  Food Insecurity:   . Worried About Charity fundraiser in the Last Year: Not on file  . Ran Out of Food in the Last Year: Not on file  Transportation Needs:   . Lack of Transportation (Medical): Not on file  . Lack of Transportation (Non-Medical): Not on file  Physical Activity:   . Days of Exercise per Week: Not on file  . Minutes of Exercise per Session: Not on file  Stress:   . Feeling of Stress : Not on file  Social Connections:   . Frequency of Communication with Friends and Family: Not on file  . Frequency of Social Gatherings with Friends and Family: Not on file  . Attends Religious Services: Not on file  . Active Member of Clubs or Organizations: Not on file  . Attends Archivist Meetings: Not on file  . Marital Status: Not on file     Family History: The patient's family history includes Cancer in her mother; Heart disease in her brother, brother, and father. ROS:   Please see the history of present illness.    All 14 point review of systems negative except as described per history of present illness  EKGs/Labs/Other Studies Reviewed:      Recent Labs: 02/01/2020: BUN 21; Creatinine, Ser 0.94; Hemoglobin 14.2; Platelets 284; Potassium 4.0; Sodium 136 02/02/2020: TSH 2.663  Recent Lipid Panel    Component Value Date/Time   CHOL 269 (H) 02/02/2020 1010   CHOL 267 (H) 05/01/2019 1124   TRIG 133 02/02/2020 1010   HDL 59 02/02/2020 1010   HDL 64 05/01/2019 1124   CHOLHDL 4.6 02/02/2020 1010   VLDL 27 02/02/2020 1010   LDLCALC 183 (H) 02/02/2020 1010   LDLCALC 182 (H) 05/01/2019 1124    Physical Exam:    VS:  BP 130/90 (BP Location: Right Arm, Patient Position: Sitting)   Pulse 77   Ht 5' (1.524 m)   Wt  153 lb (69.4 kg)   SpO2 99%   BMI 29.88 kg/m     Wt Readings from Last 3 Encounters:  03/31/20 153 lb (69.4 kg)  02/05/20 149 lb (67.6 kg)  02/02/20 147 lb 11.3 oz (67 kg)     GEN:  Well nourished, well developed in no acute distress HEENT: Normal NECK: No JVD; No carotid bruits LYMPHATICS: No lymphadenopathy CARDIAC: RRR, no murmurs, no rubs, no gallops RESPIRATORY:  Clear to auscultation without rales, wheezing or rhonchi  ABDOMEN: Soft, non-tender, non-distended MUSCULOSKELETAL:  No edema; No deformity  SKIN:  Warm and dry LOWER EXTREMITIES: no swelling NEUROLOGIC:  Alert and oriented x 3 PSYCHIATRIC:  Normal affect   ASSESSMENT:    1. Parkinson disease ([place])   2. Coronary artery disease involving native coronary artery of native heart without angina pectoris   3. Hypertension, unspecified type   4. Syncope and collapse   5. Dyslipidemia (high LDL; low HDL)    PLAN:    In order of problems listed above:  1. Coronary artery disease: Doing well from that point review.  Denies have any symptoms. 2. Essential hypertension: Her blood pressure is well controlled continue present management. 3. Syncope and collapse quite extensive evaluation has been also for which is unrevealing, she does implantable loop recorder so far no significant arrhythmia identified.  She actually had a long discussion with me talking about this.  She convinced she may be developing Parkinson.  I told her I do not see any thing that would suggest that.  She tells me that she does have some shakiness of the left arm.  Apparently her father developed Parkinson when he was 31.  She also read about the fact that Parkinson's can give her dizziness I told her that this usually different type of dizziness.  She mentioned that she lost sense of smell and she is really worried about some neurological problem going on.  I will refer her to neurology to be evaluated however I doubt very much that this is a  Parkinson. 4. Dyslipidemia: She is on Praluent which I will continue.  I did review her K PN which show me her LDL well 583 this is from February 02, 2020.  We will redo the test.   Medication Adjustments/Labs and Tests Ordered: Current medicines are reviewed at length with the patient today.  Concerns regarding medicines are outlined above.  Orders Placed This Encounter  Procedures  . Ambulatory referral to Neurology   Medication changes: No orders of the defined types were placed in this encounter.   Signed, Park Liter, MD, [facility] 03/31/2020 1:28 PM    [facility]

## 2020-04-02 ENCOUNTER — Encounter: Payer: Self-pay | Admitting: Neurology

## 2020-04-15 NOTE — Progress Notes (Deleted)
Assessment/Plan:   1.  Dizziness  -Patient felt that she had Parkinson's disease as the etiology for the dizziness.  Reassured patient that she does not have Parkinson's disease.  She does not meet [place] brain bank or modified MDS criteria for the diagnosis of Parkinson's disease.  She was worried about Parkinson's disease because of her family history of Parkinson's disease.  Discussed with the patient that most cases of Parkinson's disease are not genetic.  -Patient has had very extensive work-up for causes of dizziness, weakness, fatigue.  Her neurologic examination is nonfocal and nonlateralizing. ***  Subjective:   Penny Boyer was seen today in the movement disorders clinic for neurologic consultation at the request of Park Liter, MD.  The consultation is for the evaluation of possible Parkinson's disease.  Records from referring physician are available.  Patient apparently has had history of syncope, for which she has had an extensive evaluation, which has been unrevealing.  She has a loop recorder currently implanted (just put into place after syncopal episode in September, 2021).  Patient apparently mentioned to referring physician that she thought that she potentially had Parkinson's disease, and she felt that this would explain the etiology for her dizziness and syncope.  Referring physician notes indicate that they did not see any evidence of Parkinson's disease, but she is here for further evaluation.   Specific Symptoms:  Tremor: {yes no:314532} Family hx of similar:  Yes.  , Father Voice: *** Sleep: ***  Vivid Dreams:  {yes no:314532}  Acting out dreams:  {yes no:314532} Wet Pillows: {yes no:314532} Postural symptoms:  {yes no:314532}  Falls?  {yes no:314532} Bradykinesia symptoms: {parkinson brady:18041} Loss of smell:  {yes no:314532} Loss of taste:  {yes no:314532} Urinary Incontinence:  {yes no:314532} Difficulty Swallowing:  {yes  no:314532} Handwriting, micrographia: {yes no:314532} Trouble with ADL's:  {yes no:314532}  Trouble buttoning clothing: {yes no:314532} Depression:  {yes no:314532} Memory changes:  {yes no:314532} Hallucinations:  {yes no:314532}  visual distortions: {yes no:314532} N/V:  {yes no:314532} Lightheaded:  {yes no:314532}  Syncope: {yes no:314532} Diplopia:  {yes no:314532} Dyskinesia:  {yes no:314532} Prior exposure to reglan/antipsychotics: {yes no:314532}  Neuroimaging of the brain has *** previously been performed.  It *** available for my review today.  CT brain performed February 02, 2020 and was unremarkable.  I personally reviewed that.  PREVIOUS MEDICATIONS: {Parkinson's RX:18200}  ALLERGIES:   Allergies  Allergen Reactions  . Atorvastatin Other (See Comments)    JOINT PAIN  . Hydrochlorothiazide W-Triamterene Other (See Comments)    UNKNOWN    CURRENT MEDICATIONS:  Current Outpatient Medications  Medication Instructions  . albuterol (VENTOLIN HFA) 108 (90 Base) MCG/ACT inhaler 2 puffs, Inhalation, Every 6 hours PRN  . Alirocumab (PRALUENT) 75 MG/ML SOAJ Subcutaneous, Every 14 days  . aspirin (GOODSENSE ASPIRIN) 325 mg, Oral, Daily at bedtime  . famotidine-calcium carbonate-magnesium hydroxide (PEPCID COMPLETE) [phone] MG chewable tablet 1 tablet, Oral, Daily PRN  . ibuprofen (ADVIL) 800 mg, Oral, Every 6 hours PRN  . isosorbide mononitrate (IMDUR) 120 mg, Oral, Daily  . losartan (COZAAR) 100 mg, Oral, Daily  . nitroGLYCERIN (NITROSTAT) 0.4 mg, Sublingual, As needed  . ranolazine (RANEXA) 1,000 mg, Oral, 2 times daily    Objective:   VITALS:  There were no vitals filed for this visit.  GEN:  The patient appears stated age and is in NAD. HEENT:  Normocephalic, atraumatic.  The mucous membranes are moist. The superficial temporal arteries are without ropiness or tenderness. CV:  RRR Lungs:  CTAB Neck/HEME:  There are no carotid bruits  bilaterally.  Neurological examination:  Orientation: The patient is alert and oriented x3.  Cranial nerves: There is good facial symmetry. Extraocular muscles are intact. The visual fields are full to confrontational testing. The speech is fluent and clear. Soft palate rises symmetrically and there is no tongue deviation. Hearing is intact to conversational tone. Sensation: Sensation is intact to light and pinprick throughout (facial, trunk, extremities). Vibration is intact at the bilateral big toe. There is no extinction with double simultaneous stimulation. There is no sensory dermatomal level identified. Motor: Strength is 5/5 in the bilateral upper and lower extremities.   Shoulder shrug is equal and symmetric.  There is no pronator drift. Deep tendon reflexes: Deep tendon reflexes are 2/4 at the bilateral biceps, triceps, brachioradialis, patella and achilles. Plantar responses are downgoing bilaterally.  Movement examination: Tone: There is ***tone in the bilateral upper extremities.  The tone in the lower extremities is ***.  Abnormal movements: *** Coordination:  There is *** decremation with RAM's, *** Gait and Station: The patient has *** difficulty arising out of a deep-seated chair without the use of the hands. The patient's stride length is ***.  The patient has a *** pull test.     I have reviewed and interpreted the following labs independently   Chemistry      Component Value Date/Time   NA 136 02/01/2020 1558   NA 142 12/13/2018 1418   K 4.0 02/01/2020 1558   CL 100 02/01/2020 1558   CO2 26 02/01/2020 1558   BUN 21 02/01/2020 1558   BUN 18 12/13/2018 1418   CREATININE 0.94 02/01/2020 1558      Component Value Date/Time   CALCIUM 9.9 02/01/2020 1558      Lab Results  Component Value Date   TSH 2.663 02/02/2020   Lab Results  Component Value Date   WBC 7.7 02/01/2020   HGB 14.2 02/01/2020   HCT 42.2 02/01/2020   MCV 87.0 02/01/2020   PLT 284 02/01/2020      Total time spent on today's visit was ***60 minutes, including both face-to-face time and nonface-to-face time.  Time included that spent on review of records (prior notes available to me/labs/imaging if pertinent), discussing treatment and goals, answering patient's questions and coordinating care.  Cc:  [place]

## 2020-04-21 ENCOUNTER — Ambulatory Visit (INDEPENDENT_AMBULATORY_CARE_PROVIDER_SITE_OTHER): Payer: No Typology Code available for payment source

## 2020-04-21 ENCOUNTER — Ambulatory Visit: Payer: No Typology Code available for payment source | Admitting: Neurology

## 2020-04-21 DIAGNOSIS — R55 Syncope and collapse: Secondary | ICD-10-CM

## 2020-04-23 LAB — CUP PACEART REMOTE DEVICE CHECK
Date Time Interrogation Session: 20211204115850
Implantable Pulse Generator Implant Date: 20210921

## 2020-05-01 NOTE — Progress Notes (Signed)
[facility] Summary Report / Loop Recorder 

## 2020-05-06 ENCOUNTER — Encounter: Payer: Self-pay | Admitting: Cardiology

## 2020-05-22 LAB — CUP PACEART REMOTE DEVICE CHECK
Date Time Interrogation Session: 20220106120008
Implantable Pulse Generator Implant Date: 20210921

## 2020-05-26 ENCOUNTER — Ambulatory Visit (INDEPENDENT_AMBULATORY_CARE_PROVIDER_SITE_OTHER): Payer: No Typology Code available for payment source

## 2020-05-26 DIAGNOSIS — R55 Syncope and collapse: Secondary | ICD-10-CM

## 2020-06-09 NOTE — Progress Notes (Signed)
[facility] Summary Report / Loop Recorder 

## 2020-06-19 ENCOUNTER — Encounter: Payer: Self-pay | Admitting: Cardiology

## 2020-06-19 DIAGNOSIS — R55 Syncope and collapse: Secondary | ICD-10-CM

## 2020-06-25 LAB — CUP PACEART REMOTE DEVICE CHECK
Date Time Interrogation Session: 20220208120136
Implantable Pulse Generator Implant Date: 20210921

## 2020-06-30 ENCOUNTER — Ambulatory Visit (INDEPENDENT_AMBULATORY_CARE_PROVIDER_SITE_OTHER): Payer: No Typology Code available for payment source

## 2020-06-30 DIAGNOSIS — R55 Syncope and collapse: Secondary | ICD-10-CM | POA: Diagnosis not present

## 2020-07-07 NOTE — Progress Notes (Signed)
[facility] Summary Report / Loop Recorder 

## 2020-07-29 ENCOUNTER — Other Ambulatory Visit: Payer: Self-pay

## 2020-07-29 ENCOUNTER — Ambulatory Visit (INDEPENDENT_AMBULATORY_CARE_PROVIDER_SITE_OTHER): Payer: No Typology Code available for payment source | Admitting: Cardiology

## 2020-07-29 ENCOUNTER — Encounter: Payer: Self-pay | Admitting: Cardiology

## 2020-07-29 VITALS — BP 122/86 | HR 82 | Ht 60.0 in | Wt 159.0 lb

## 2020-07-29 DIAGNOSIS — E785 Hyperlipidemia, unspecified: Secondary | ICD-10-CM

## 2020-07-29 DIAGNOSIS — R079 Chest pain, unspecified: Secondary | ICD-10-CM | POA: Diagnosis not present

## 2020-07-29 DIAGNOSIS — R55 Syncope and collapse: Secondary | ICD-10-CM | POA: Diagnosis not present

## 2020-07-29 DIAGNOSIS — I2510 Atherosclerotic heart disease of native coronary artery without angina pectoris: Secondary | ICD-10-CM | POA: Diagnosis not present

## 2020-07-29 MED ORDER — NITROGLYCERIN 0.4 MG SL SUBL: 0.4000 mg | SUBLINGUAL_TABLET | SUBLINGUAL | 5 refills | Status: DC | PRN

## 2020-07-29 NOTE — Progress Notes (Signed)
Cardiology Office Note:    Date:  07/29/2020   ID:  Penny Boyer, DOB [birth year], MRN [id number]  PCP:  [place]  Cardiologist:  Jenne Campus, MD    Referring MD: [place]   Chief Complaint  Patient presents with  . Follow-up  I am tired and exhausted  History of Present Illness:    Penny Boyer is a 65 y.o. female   with past medical history significant for coronary artery disease.She did have a cardiac catheterization in 2017 with PTCA and stenting of the circumflex artery done in [place]. After thatShe had a stress test done which showed ischemia in [place] cardiac catheterization after that showed diagonal branch disease and decision has been made to pursue medical therapy Recent she got implantable loop recorder inserted [place] today 2 months of follow-up denies have any chest pain tightness squeezing pressure burning chest.  The leading complaint is fatigue tiredness and exhaustion.  However she works all the time she works in the store also works in the barn and gets up every day 6:00 in the morning and work until 11:00.  I think really truly she is just simply exhausted.  Denies have any cardiac complaint, no passing out since she got the implantable loop recorder.  Past Medical History:  Diagnosis Date  . Abnormal nuclear stress test 12/20/2018  . Chest pain 02/02/2020  . Coronary artery disease involving native coronary artery has post PTCA and stenting of the circumflex artery in March 2017 of native heart 07/28/2016   Overview:  PTCA and stent of LCX artery in March 2017 in [place]  . Dyslipidemia (high LDL; low HDL) 07/28/2016  . Hyperlipidemia   . Hypertension   . Kidney stone 10/17/2019  . Precordial pain 07/28/2016  . Syncope     Past Surgical History:  Procedure Laterality Date  . ABDOMINAL HYSTERECTOMY    . APPENDECTOMY    . CARDIAC CATHETERIZATION    . CHOLECYSTECTOMY    . FOOT SURGERY    . INTRAVASCULAR PRESSURE  WIRE/FFR STUDY N/A 12/20/2018   Procedure: INTRAVASCULAR PRESSURE WIRE/FFR STUDY;  Surgeon: Leonie Man, MD;  Location: [facility];  Service: Cardiovascular;  Laterality: N/A;  . LEFT HEART CATH AND CORONARY ANGIOGRAPHY N/A 12/20/2018   Procedure: LEFT HEART CATH AND CORONARY ANGIOGRAPHY;  Surgeon: Leonie Man, MD;  Location: [facility];  Service: Cardiovascular;  Laterality: N/A;  . MANDIBLE FRACTURE SURGERY    . Ulnar Surgery    . WRIST FRACTURE SURGERY      Current Medications: Current Meds  Medication Sig  . Alirocumab (PRALUENT) 75 MG/ML SOAJ Inject into the skin every 14 (fourteen) days.  [name] aspirin EC 81 MG tablet Take 81 mg by mouth daily. Swallow whole.  . busPIRone (BUSPAR) 10 MG tablet Take 5 mg by mouth 3 (three) times daily. Take 1/2 tablet 3 times daily  . isosorbide mononitrate (IMDUR) 120 MG 24 hr tablet Take 1 tablet (120 mg total) by mouth daily.  [name] losartan (COZAAR) 100 MG tablet Take 1 tablet (100 mg total) by mouth daily.  . nitroGLYCERIN (NITROSTAT) 0.4 MG SL tablet Place 1 tablet (0.4 mg total) under the tongue as needed for chest pain.  . ranolazine (RANEXA) 1000 MG SR tablet Take 1 tablet (1,000 mg total) by mouth 2 (two) times daily.  . [DISCONTINUED] nitroGLYCERIN (NITROSTAT) 0.4 MG SL tablet Place 1 tablet (0.4 mg total) under the tongue as needed for chest pain.  Allergies:   Atorvastatin and Hydrochlorothiazide w-triamterene   Social History   Socioeconomic History  . Marital status: Married    Spouse name: Not on file  . Number of children: Not on file  . Years of education: Not on file  . Highest education level: Not on file  Occupational History  . Not on file  Tobacco Use  . Smoking status: Former Research scientist (life sciences)  . Smokeless tobacco: Never Used  Vaping Use  . Vaping Use: Never used  Substance and Sexual Activity  . Alcohol use: Yes  . Drug use: No  . Sexual activity: Not on file  Other Topics Concern  . Not on file  Social  History Narrative  . Not on file   Social Determinants of Health   Financial Resource Strain: Not on file  Food Insecurity: Not on file  Transportation Needs: Not on file  Physical Activity: Not on file  Stress: Not on file  Social Connections: Not on file     Family History: The patient's family history includes Cancer in her mother; Heart disease in her brother, brother, and father. ROS:   Please see the history of present illness.    All 14 point review of systems negative except as described per history of present illness  EKGs/Labs/Other Studies Reviewed:      Recent Labs: 02/01/2020: BUN 21; Creatinine, Ser 0.94; Hemoglobin 14.2; Platelets 284; Potassium 4.0; Sodium 136 02/02/2020: TSH 2.663  Recent Lipid Panel    Component Value Date/Time   CHOL 269 (H) 02/02/2020 1010   CHOL 267 (H) 05/01/2019 1124   TRIG 133 02/02/2020 1010   HDL 59 02/02/2020 1010   HDL 64 05/01/2019 1124   CHOLHDL 4.6 02/02/2020 1010   VLDL 27 02/02/2020 1010   LDLCALC 183 (H) 02/02/2020 1010   LDLCALC 182 (H) 05/01/2019 1124    Physical Exam:    VS:  BP 122/86 (BP Location: Left Arm, Patient Position: Sitting, Cuff Size: Normal)   Pulse 82   Ht 5' (1.524 m)   Wt 159 lb (72.1 kg)   SpO2 93%   BMI 31.05 kg/m     Wt Readings from Last 3 Encounters:  07/29/20 159 lb (72.1 kg)  03/31/20 153 lb (69.4 kg)  02/05/20 149 lb (67.6 kg)     GEN:  Well nourished, well developed in no acute distress HEENT: Normal NECK: No JVD; No carotid bruits LYMPHATICS: No lymphadenopathy CARDIAC: RRR, no murmurs, no rubs, no gallops RESPIRATORY:  Clear to auscultation without rales, wheezing or rhonchi  ABDOMEN: Soft, non-tender, non-distended MUSCULOSKELETAL:  No edema; No deformity  SKIN: Warm and dry LOWER EXTREMITIES: no swelling NEUROLOGIC:  Alert and oriented x 3 PSYCHIATRIC:  Normal affect   ASSESSMENT:    1. Coronary artery disease involving native coronary artery of native heart without  angina pectoris   2. Dyslipidemia (high LDL; low HDL)   3. Chest pain, unspecified type   4. Syncope and collapse    PLAN:    In order of problems listed above:  1. Coronary disease doing well from that point review asymptomatic I continue present management. 2. Dyslipidemia: I will recheck her fasting lipid profile.  I did review K PN and show me some alarming numbers this is from September 2021 with LDL of 183 HDL 59 3. Chest pain denies having any. 4. Syncope and collapse interrogation of the device did not show any significant arrhythmia. 5. Profound fatigue and tiredness I think simply she need to have some  time off.  I will check her echocardiogram actually structurally her heart is normal. 6.    Medication Adjustments/Labs and Tests Ordered: Current medicines are reviewed at length with the patient today.  Concerns regarding medicines are outlined above.  No orders of the defined types were placed in this encounter.  Medication changes:  Meds ordered this encounter  Medications  . nitroGLYCERIN (NITROSTAT) 0.4 MG SL tablet    Sig: Place 1 tablet (0.4 mg total) under the tongue as needed for chest pain.    Dispense:  25 tablet    Refill:  5    Signed, Park Liter, MD, [facility] 07/29/2020 2:36 PM    [facility]

## 2020-07-29 NOTE — Patient Instructions (Signed)
Medication Instructions:  Your physician has recommended you take : Vacation (With 3 refills)    *If you need a refill on your cardiac medications before your next appointment, please call your pharmacy*   Lab Work: Your physician recommends that you return for lab work today: lipid  If you have labs (blood work) drawn today and your tests are completely normal, you will receive your results only by: [name] MyChart Message (if you have MyChart) OR . A paper copy in the mail If you have any lab test that is abnormal or we need to change your treatment, we will call you to review the results.   Testing/Procedures: Your physician has requested that you have an echocardiogram. Echocardiography is a painless test that uses sound waves to create images of your heart. It provides your doctor with information about the size and shape of your heart and how well your heart's chambers and valves are working. This procedure takes approximately one hour. There are no restrictions for this procedure.     Follow-Up: At [facility], you and your health needs are our priority.  As part of our continuing mission to provide you with exceptional heart care, we have created designated Provider Care Teams.  These Care Teams include your primary Cardiologist (physician) and Advanced Practice Providers (APPs -  Physician Assistants and Nurse Practitioners) who all work together to provide you with the care you need, when you need it.  We recommend signing up for the patient portal called "MyChart".  Sign up information is provided on this After Visit Summary.  MyChart is used to connect with patients for Virtual Visits (Telemedicine).  Patients are able to view lab/test results, encounter notes, upcoming appointments, etc.  Non-urgent messages can be sent to your provider as well.   To learn more about what you can do with MyChart, go to [email or web address].    Your next appointment:   6 month(s)  The format  for your next appointment:   In Person  Provider:   Jenne Campus, MD   Other Instructions   Echocardiogram An echocardiogram is a test that uses sound waves (ultrasound) to produce images of the heart. Images from an echocardiogram can provide important information about:  Heart size and shape.  The size and thickness and movement of your heart's walls.  Heart muscle function and strength.  Heart valve function or if you have stenosis. Stenosis is when the heart valves are too narrow.  If blood is flowing backward through the heart valves (regurgitation).  A tumor or infectious growth around the heart valves.  Areas of heart muscle that are not working well because of poor blood flow or injury from a heart attack.  Aneurysm detection. An aneurysm is a weak or damaged part of an artery wall. The wall bulges out from the normal force of blood pumping through the body. Tell a health care provider about:  Any allergies you have.  All medicines you are taking, including vitamins, herbs, eye drops, creams, and over-the-counter medicines.  Any blood disorders you have.  Any surgeries you have had.  Any medical conditions you have.  Whether you are pregnant or may be pregnant. What are the risks? Generally, this is a safe test. However, problems may occur, including an allergic reaction to dye (contrast) that may be used during the test. What happens before the test? No specific preparation is needed. You may eat and drink normally. What happens during the test?  You will take off  your clothes from the waist up and put on a hospital gown.  Electrodes or electrocardiogram (ECG)patches may be placed on your chest. The electrodes or patches are then connected to a device that monitors your heart rate and rhythm.  You will lie down on a table for an ultrasound exam. A gel will be applied to your chest to help sound waves pass through your skin.  A handheld device, called  a transducer, will be pressed against your chest and moved over your heart. The transducer produces sound waves that travel to your heart and bounce back (or "echo" back) to the transducer. These sound waves will be captured in real-time and changed into images of your heart that can be viewed on a video monitor. The images will be recorded on a computer and reviewed by your health care provider.  You may be asked to change positions or hold your breath for a short time. This makes it easier to get different views or better views of your heart.  In some cases, you may receive contrast through an IV in one of your veins. This can improve the quality of the pictures from your heart. The procedure may vary among health care providers and hospitals.   What can I expect after the test? You may return to your normal, everyday life, including diet, activities, and medicines, unless your health care provider tells you not to do that. Follow these instructions at home:  It is up to you to get the results of your test. Ask your health care provider, or the department that is doing the test, when your results will be ready.  Keep all follow-up visits. This is important. Summary  An echocardiogram is a test that uses sound waves (ultrasound) to produce images of the heart.  Images from an echocardiogram can provide important information about the size and shape of your heart, heart muscle function, heart valve function, and other possible heart problems.  You do not need to do anything to prepare before this test. You may eat and drink normally.  After the echocardiogram is completed, you may return to your normal, everyday life, unless your health care provider tells you not to do that. This information is not intended to replace advice given to you by your health care provider. Make sure you discuss any questions you have with your health care provider. Document Revised: 12/25/2019 Document Reviewed:  12/25/2019 Elsevier Patient Education  2021 [employer].

## 2020-07-30 LAB — LIPID PANEL
Chol/HDL Ratio: 3.5 ratio (ref 0.0–4.4)
Cholesterol, Total: 227 mg/dL — ABNORMAL HIGH (ref 100–199)
HDL: 64 mg/dL (ref >39)
LDL Chol Calc (NIH): 140 mg/dL — ABNORMAL HIGH (ref 0–99)
Triglycerides: 131 mg/dL (ref 0–149)
VLDL Cholesterol Cal: 23 mg/dL (ref 5–40)

## 2020-08-01 ENCOUNTER — Telehealth: Payer: Self-pay

## 2020-08-01 NOTE — Telephone Encounter (Signed)
Per patient is on Praluent twice a month and she is informed of results. Messaged Dr. Elmon Kirschner for actions.

## 2020-08-01 NOTE — Telephone Encounter (Signed)
-----   Message from Park Liter, MD sent at 08/01/2020 12:22 PM EDT ----- Is she still taking Praluent, her cholesterol still unacceptably high.

## 2020-08-04 ENCOUNTER — Ambulatory Visit (INDEPENDENT_AMBULATORY_CARE_PROVIDER_SITE_OTHER): Payer: No Typology Code available for payment source

## 2020-08-04 DIAGNOSIS — R55 Syncope and collapse: Secondary | ICD-10-CM | POA: Diagnosis not present

## 2020-08-04 LAB — CUP PACEART REMOTE DEVICE CHECK
Date Time Interrogation Session: 20220321074620
Implantable Pulse Generator Implant Date: 20210921

## 2020-08-07 ENCOUNTER — Telehealth: Payer: Self-pay

## 2020-08-07 DIAGNOSIS — E785 Hyperlipidemia, unspecified: Secondary | ICD-10-CM

## 2020-08-07 NOTE — Telephone Encounter (Signed)
-----   Message from Park Liter, MD sent at 08/07/2020 10:41 AM EDT ----- Well this is a very high cholesterol.  She needs to be refer to lipid clinic ----- Message ----- From: Darrel Reach, CMA Sent: 08/01/2020   2:18 PM EDT To: Park Liter, MD  Yes she is on Praluent twice a month. What the next step?

## 2020-08-07 NOTE — Telephone Encounter (Signed)
Patient aware of referral. She is aware she will be contacted for an appointment. Patient has requested to verify [facility] referral for approval. Message sent to Lipid clinic

## 2020-08-12 ENCOUNTER — Telehealth: Payer: Self-pay | Admitting: Cardiology

## 2020-08-12 MED ORDER — RANOLAZINE ER 1000 MG PO TB12
1000.0000 mg | ORAL_TABLET | Freq: Two times a day (BID) | ORAL | 1 refills | Status: DC
Start: 2020-08-12 — End: 2020-08-12

## 2020-08-12 MED ORDER — ISOSORBIDE MONONITRATE ER 120 MG PO TB24
120.0000 mg | ORAL_TABLET | Freq: Every day | ORAL | 1 refills | Status: DC
Start: 2020-08-12 — End: 2021-03-13

## 2020-08-12 MED ORDER — PRALUENT 75 MG/ML ~~LOC~~ SOAJ: 2.0000 mL | SUBCUTANEOUS | 3 refills | Status: DC

## 2020-08-12 MED ORDER — LOSARTAN POTASSIUM 100 MG PO TABS
100.0000 mg | ORAL_TABLET | Freq: Every day | ORAL | 1 refills | Status: DC
Start: 2020-08-12 — End: 2021-03-13

## 2020-08-12 MED ORDER — LOSARTAN POTASSIUM 100 MG PO TABS
100.0000 mg | ORAL_TABLET | Freq: Every day | ORAL | 1 refills | Status: DC
Start: 2020-08-12 — End: 2020-08-12

## 2020-08-12 MED ORDER — RANOLAZINE ER 1000 MG PO TB12
1000.0000 mg | ORAL_TABLET | Freq: Two times a day (BID) | ORAL | 1 refills | Status: DC
Start: 2020-08-12 — End: 2021-03-13

## 2020-08-12 MED ORDER — ISOSORBIDE MONONITRATE ER 120 MG PO TB24
120.0000 mg | ORAL_TABLET | Freq: Every day | ORAL | 1 refills | Status: DC
Start: 2020-08-12 — End: 2020-08-12

## 2020-08-12 NOTE — Telephone Encounter (Signed)
*  STAT* If patient is at the pharmacy, call can be transferred to refill team.   1. Which medications need to be refilled? (please list name of each medication and dose if known)  ranolazine (RANEXA) 1000 MG SR tablet isosorbide mononitrate (IMDUR) 120 MG 24 hr tablet Alirocumab (PRALUENT) 75 MG/ML SOAJ losartan (COZAAR) 100 MG tablet  2. Which pharmacy/location (including street and city if local pharmacy) is medication to be sent to? [place], [place].  3. Do they need a 30 day or 90 day supply? 90  Patient states she does not use the [facility] drug for anything other than her Nitro. All Medications need to go to the [facility]

## 2020-08-12 NOTE — Progress Notes (Signed)
[facility] Summary Report / Loop Recorder 

## 2020-08-12 NOTE — Telephone Encounter (Signed)
Refills sent in per request 

## 2020-08-19 ENCOUNTER — Other Ambulatory Visit: Payer: Self-pay

## 2020-08-19 ENCOUNTER — Ambulatory Visit (INDEPENDENT_AMBULATORY_CARE_PROVIDER_SITE_OTHER): Payer: No Typology Code available for payment source

## 2020-08-19 DIAGNOSIS — E785 Hyperlipidemia, unspecified: Secondary | ICD-10-CM | POA: Diagnosis not present

## 2020-08-19 DIAGNOSIS — R079 Chest pain, unspecified: Secondary | ICD-10-CM

## 2020-08-19 DIAGNOSIS — I2510 Atherosclerotic heart disease of native coronary artery without angina pectoris: Secondary | ICD-10-CM

## 2020-08-19 DIAGNOSIS — R55 Syncope and collapse: Secondary | ICD-10-CM | POA: Diagnosis not present

## 2020-08-19 LAB — ECHOCARDIOGRAM COMPLETE
Area-P 1/2: 3.99 cm2
S' Lateral: 2.2 cm

## 2020-08-21 ENCOUNTER — Telehealth: Payer: Self-pay

## 2020-08-21 NOTE — Telephone Encounter (Signed)
-----   Message from Park Liter, MD sent at 08/20/2020  1:04 PM EDT ----- Echocardiogram showed normal left ventricle ejection fraction, overall looks good

## 2020-08-21 NOTE — Telephone Encounter (Signed)
Spoke with patient regarding results and recommendation.  Patient verbalizes understanding and is agreeable to plan of care. Advised patient to call back with any issues or concerns.  

## 2020-08-25 ENCOUNTER — Other Ambulatory Visit: Payer: No Typology Code available for payment source

## 2020-08-27 ENCOUNTER — Encounter: Payer: Self-pay | Admitting: Internal Medicine

## 2020-08-27 ENCOUNTER — Telehealth: Payer: Self-pay | Admitting: Internal Medicine

## 2020-08-27 ENCOUNTER — Telehealth (INDEPENDENT_AMBULATORY_CARE_PROVIDER_SITE_OTHER): Payer: No Typology Code available for payment source | Admitting: Internal Medicine

## 2020-08-27 VITALS — BP 118/88 | Wt 145.0 lb

## 2020-08-27 DIAGNOSIS — M791 Myalgia, unspecified site: Secondary | ICD-10-CM | POA: Diagnosis not present

## 2020-08-27 DIAGNOSIS — E7800 Pure hypercholesterolemia, unspecified: Secondary | ICD-10-CM | POA: Diagnosis not present

## 2020-08-27 DIAGNOSIS — I2510 Atherosclerotic heart disease of native coronary artery without angina pectoris: Secondary | ICD-10-CM | POA: Diagnosis not present

## 2020-08-27 DIAGNOSIS — T466X5A Adverse effect of antihyperlipidemic and antiarteriosclerotic drugs, initial encounter: Secondary | ICD-10-CM

## 2020-08-27 MED ORDER — PRALUENT 150 MG/ML ~~LOC~~ SOAJ: 1.0000 | SUBCUTANEOUS | 11 refills | Status: DC

## 2020-08-27 MED ORDER — ROSUVASTATIN CALCIUM 5 MG PO TABS: 5.0000 mg | ORAL_TABLET | Freq: Every day | ORAL | 3 refills | Status: DC

## 2020-08-27 NOTE — Patient Instructions (Signed)
Medication Instructions:  START crestor 5mg  daily INCREASE praluent to 150mg /mL once every 14 days  *If you need a refill on your cardiac medications before your next appointment, please call your pharmacy*   Lab Work: FASTING lab work in 3-4 months to check cholesterol   If you have labs (blood work) drawn today and your tests are completely normal, you will receive your results only by: [name] MyChart Message (if you have MyChart) OR . A paper copy in the mail If you have any lab test that is abnormal or we need to change your treatment, we will call you to review the results.   Testing/Procedures: NONE   Follow-Up: At [facility], you and your health needs are our priority.  As part of our continuing mission to provide you with exceptional heart care, we have created designated Provider Care Teams.  These Care Teams include your primary Cardiologist (physician) and Advanced Practice Providers (APPs -  Physician Assistants and Nurse Practitioners) who all work together to provide you with the care you need, when you need it.  We recommend signing up for the patient portal called "MyChart".  Sign up information is provided on this After Visit Summary.  MyChart is used to connect with patients for Virtual Visits (Telemedicine).  Patients are able to view lab/test results, encounter notes, upcoming appointments, etc.  Non-urgent messages can be sent to your provider as well.   To learn more about what you can do with MyChart, go to [email or web address].    Your next appointment:   3-4 month(s) - lipid clinic  The format for your next appointment:   In Person  Provider:   K. Mali Hilty, MD   Other Instructions

## 2020-08-27 NOTE — Telephone Encounter (Signed)
Patient called to review e-visit instructions. Patient aware that the following changes have been made: start crestor 5mg  daily, increase praluent to 150mg /ml every 14 days Patient aware that they will need the following labs: fasting lab work in 3-4 months - NMR, LP(a) Patient aware that they will need the following test(s): NONE  Recall for n/a entered.  Scheduled for appointment July 26 @ 9am - virtual/phone  No further assistance needed at this time.

## 2020-08-27 NOTE — Progress Notes (Signed)
Virtual Visit via Telephone Note   This visit type was conducted due to national recommendations for restrictions regarding the COVID-19 Pandemic (e.g. social distancing) in an effort to limit this patient's exposure and mitigate transmission in our community.  Due to her co-morbid illnesses, this patient is at least at moderate risk for complications without adequate follow up.  This format is felt to be most appropriate for this patient at this time.  The patient did not have access to video technology/had technical difficulties with video requiring transitioning to audio format only (telephone).  All issues noted in this document were discussed and addressed.  No physical exam could be performed with this format.  Please refer to the patient's chart for her  consent to telehealth for [facility].   Date:  08/27/2020   ID:  Penny Boyer, DOB [birth year], MRN [id number] The patient was identified using 2 identifiers.  Evaluation Performed:  New Patient Evaluation  Patient Location:  [place] [address]  Provider location:   [address] [place] [address], [place] [address]  PCP:  [facility]  Cardiologist:  Jenne Campus, MD Electrophysiologist:  Vickie Epley, MD   Chief Complaint: Manage dyslipidemia  History of Present Illness:    Penny Boyer is a 65 y.o. female who presents via audio/video conferencing for a telehealth visit today.  This is a pleasant 65 year old female with a history of coronary artery disease status post PCI to the circumflex artery in 2017 in [place], dyslipidemia, hypertension and family history of heart disease in her father.  She has had significantly elevated cholesterol in the past with total cholesterol of 280 a year ago and LDL 196, strongly suggestive of a familial hyperlipidemia.  She initially had been trialed on atorvastatin which caused significant joint pains.  Subsequently she had also tried  pravastatin and ezetimibe according to the chart.  All of which seem to cause her side effects.  She was then switched to Praluent 75 mg daily which she seems to tolerate.  Unfortunately, she has not had a significant reduction in her lipids.  Total cholesterol is now 227, triglycerides 131, HDL 64 and LDL 140.  Her target LDL is less than 70.  She reports a very clean diet which is fairly healthy, low in saturated fats and she is very active.  She also had lost about 80 pounds.  The patient does not have symptoms concerning for COVID-19 infection (fever, chills, cough, or new SHORTNESS OF BREATH).    Prior CV studies:   The following studies were reviewed today:  Chart reviewed, lab work  PMHx:  Past Medical History:  Diagnosis Date  . Abnormal nuclear stress test 12/20/2018  . Chest pain 02/02/2020  . Coronary artery disease involving native coronary artery has post PTCA and stenting of the circumflex artery in March 2017 of native heart 07/28/2016   Overview:  PTCA and stent of LCX artery in March 2017 in [place]  . Dyslipidemia (high LDL; low HDL) 07/28/2016  . Hyperlipidemia   . Hypertension   . Kidney stone 10/17/2019  . Precordial pain 07/28/2016  . Syncope     Past Surgical History:  Procedure Laterality Date  . ABDOMINAL HYSTERECTOMY    . APPENDECTOMY    . CARDIAC CATHETERIZATION    . CHOLECYSTECTOMY    . FOOT SURGERY    . INTRAVASCULAR PRESSURE WIRE/FFR STUDY N/A 12/20/2018   Procedure: INTRAVASCULAR PRESSURE WIRE/FFR STUDY;  Surgeon: Glenetta Hew  W, MD;  Location: [facility];  Service: Cardiovascular;  Laterality: N/A;  . LEFT HEART CATH AND CORONARY ANGIOGRAPHY N/A 12/20/2018   Procedure: LEFT HEART CATH AND CORONARY ANGIOGRAPHY;  Surgeon: Leonie Man, MD;  Location: [facility];  Service: Cardiovascular;  Laterality: N/A;  . MANDIBLE FRACTURE SURGERY    . Ulnar Surgery    . WRIST FRACTURE SURGERY      FAMHx:  Family History  Problem Relation Age of  Onset  . Cancer Mother   . Heart disease Father   . Heart disease Brother   . Heart disease Brother     SOCHx:   reports that she has quit smoking. She has never used smokeless tobacco. She reports current alcohol use. She reports that she does not use drugs.  ALLERGIES:  Allergies  Allergen Reactions  . Atorvastatin Other (See Comments)    JOINT PAIN  . Hydrochlorothiazide W-Triamterene Other (See Comments)    UNKNOWN    MEDS:  Current Meds  Medication Sig  . Alirocumab (PRALUENT) 150 MG/ML SOAJ Inject 1 Dose into the skin every 14 (fourteen) days.  [name] aspirin EC 81 MG tablet Take 81 mg by mouth daily. Swallow whole.  . busPIRone (BUSPAR) 10 MG tablet Take 5 mg by mouth 3 (three) times daily as needed. Take 1/2 tablet 3 times daily  . isosorbide mononitrate (IMDUR) 120 MG 24 hr tablet Take 1 tablet (120 mg total) by mouth daily.  [name] losartan (COZAAR) 100 MG tablet Take 1 tablet (100 mg total) by mouth daily.  . nitroGLYCERIN (NITROSTAT) 0.4 MG SL tablet Place 1 tablet (0.4 mg total) under the tongue as needed for chest pain.  . ranolazine (RANEXA) 1000 MG SR tablet Take 1 tablet (1,000 mg total) by mouth 2 (two) times daily.  . rosuvastatin (CRESTOR) 5 MG tablet Take 1 tablet (5 mg total) by mouth daily.  . [DISCONTINUED] Alirocumab (PRALUENT) 75 MG/ML SOAJ Inject 2 mLs into the skin every 14 (fourteen) days. (Patient taking differently: Inject 1 mL into the skin every 14 (fourteen) days.)     ROS: Pertinent items noted in HPI and remainder of comprehensive ROS otherwise negative.  Labs/Other Tests and Data Reviewed:    Recent Labs: 02/01/2020: BUN 21; Creatinine, Ser 0.94; Hemoglobin 14.2; Platelets 284; Potassium 4.0; Sodium 136 02/02/2020: TSH 2.663   Recent Lipid Panel Lab Results  Component Value Date/Time   CHOL 227 (H) 07/29/2020 02:46 PM   TRIG 131 07/29/2020 02:46 PM   HDL 64 07/29/2020 02:46 PM   CHOLHDL 3.5 07/29/2020 02:46 PM   CHOLHDL 4.6 02/02/2020 10:10  AM   LDLCALC 140 (H) 07/29/2020 02:46 PM    Wt Readings from Last 3 Encounters:  08/27/20 145 lb (65.8 kg)  07/29/20 159 lb (72.1 kg)  03/31/20 153 lb (69.4 kg)     Exam:    Vital Signs:  BP 118/88   Wt 145 lb (65.8 kg)   BMI 28.32 kg/m    Exam not performed due to telephone visit  ASSESSMENT & PLAN:    1. Possible familial hyperlipidemia, LDL untreated greater than 190 2. Coronary artery disease status post PCI to the circumflex in 2017 3. Family history of heart disease in her father 53. Myalgias with statin/ezetimibe  Ms. Wartman has had some response to Praluent but is on the 75 mg dose.  Is not clear why she was not initially put on the 150 mg dose.  Clinical trials did not show any  significant difference in side effect profile between the 2 doses however there is some more efficacy with 150 mg dose.  Probably about 10 to 15% more LDL lowering.  I would recommend increasing her Praluent to the 150 mg dose however she was still not likely reach target.  Also PCSK9 inhibitors were approved as adjunct of therapy to maximally tolerated statin dose.  I would not consider her statin intolerant at this point as she has tried a couple of statins, but we could consider perhaps a trial with rosuvastatin 5 mg daily.  She will need additional lipid lowering and this could give her another 30 to 35%.  She is agreeable to this.  We will send all of her prescriptions over to the [place] and plan to get a lipid NMR and LP(a) in about 3 to 4 months with follow-up at that time.  Thanks again for the kind referral.  COVID-19 Education: The signs and symptoms of COVID-19 were discussed with the patient and how to seek care for testing (follow up with PCP or arrange E-visit).  The importance of social distancing was discussed today.  Patient Risk:   After full review of this patients clinical status, I feel that they are at least moderate risk at this time.  Time:   Today, I have spent 25 minutes with  the patient with telehealth technology discussing dyslipidemia, goal LDL, coronary artery disease, family history of heart disease.     Medication Adjustments/Labs and Tests Ordered: Current medicines are reviewed at length with the patient today.  Concerns regarding medicines are outlined above.   Tests Ordered: Orders Placed This Encounter  Procedures  . NMR, lipoprofile  . Lipoprotein A (LPA)    Medication Changes: Meds ordered this encounter  Medications  . rosuvastatin (CRESTOR) 5 MG tablet    Sig: Take 1 tablet (5 mg total) by mouth daily.    Dispense:  90 tablet    Refill:  3  . Alirocumab (PRALUENT) 150 MG/ML SOAJ    Sig: Inject 1 Dose into the skin every 14 (fourteen) days.    Dispense:  2 mL    Refill:  11    Dose change per MD    Disposition:  in 4 month(s)  Pixie Casino, MD, [facility] [place] Director of the Advanced Lipid Disorders &  Cardiovascular Risk Reduction Clinic Diplomate of the American Board of Clinical Lipidology Attending Cardiologist  Direct Dial: [phone]  Fax: [phone]  Website:  [email or web address]  Pixie Casino, MD  08/27/2020 9:13 AM

## 2020-09-02 LAB — CUP PACEART REMOTE DEVICE CHECK
Date Time Interrogation Session: 20220415115722
Implantable Pulse Generator Implant Date: 20210921

## 2020-09-04 ENCOUNTER — Ambulatory Visit (INDEPENDENT_AMBULATORY_CARE_PROVIDER_SITE_OTHER): Payer: No Typology Code available for payment source

## 2020-09-04 DIAGNOSIS — R55 Syncope and collapse: Secondary | ICD-10-CM

## 2020-09-23 NOTE — Progress Notes (Signed)
[facility] Summary Report / Loop Recorder 

## 2020-10-07 ENCOUNTER — Ambulatory Visit (INDEPENDENT_AMBULATORY_CARE_PROVIDER_SITE_OTHER): Payer: No Typology Code available for payment source

## 2020-10-07 DIAGNOSIS — R55 Syncope and collapse: Secondary | ICD-10-CM

## 2020-10-08 LAB — CUP PACEART REMOTE DEVICE CHECK
Date Time Interrogation Session: 20220518115605
Implantable Pulse Generator Implant Date: 20210921

## 2020-10-29 NOTE — Progress Notes (Signed)
[facility] Summary Report / Loop Recorder 

## 2020-11-03 ENCOUNTER — Ambulatory Visit (INDEPENDENT_AMBULATORY_CARE_PROVIDER_SITE_OTHER): Payer: No Typology Code available for payment source

## 2020-11-03 DIAGNOSIS — R55 Syncope and collapse: Secondary | ICD-10-CM

## 2020-11-03 LAB — CUP PACEART REMOTE DEVICE CHECK
Date Time Interrogation Session: 20220620115714
Implantable Pulse Generator Implant Date: 20210921

## 2020-11-24 NOTE — Progress Notes (Signed)
[facility] Summary Report / Loop Recorder 

## 2020-12-03 NOTE — Addendum Note (Signed)
Addended by: Douglass Rivers D on: 12/03/2020 09:19 AM   Modules accepted: Level of Service

## 2020-12-08 ENCOUNTER — Ambulatory Visit (INDEPENDENT_AMBULATORY_CARE_PROVIDER_SITE_OTHER): Payer: No Typology Code available for payment source

## 2020-12-08 DIAGNOSIS — R55 Syncope and collapse: Secondary | ICD-10-CM

## 2020-12-09 ENCOUNTER — Other Ambulatory Visit: Payer: Self-pay | Admitting: Internal Medicine

## 2020-12-09 ENCOUNTER — Telehealth: Payer: No Typology Code available for payment source | Admitting: Internal Medicine

## 2020-12-09 LAB — CUP PACEART REMOTE DEVICE CHECK
Date Time Interrogation Session: 20220723120622
Implantable Pulse Generator Implant Date: 20210921

## 2020-12-09 MED ORDER — PRALUENT 150 MG/ML ~~LOC~~ SOAJ: 1.0000 | SUBCUTANEOUS | 3 refills | Status: DC

## 2020-12-18 ENCOUNTER — Telehealth: Payer: Self-pay | Admitting: Internal Medicine

## 2020-12-18 NOTE — Telephone Encounter (Signed)
Called patient to schedule from recall to see Dr. Agustin Cree. Pt is under the impression that Dr. Debara Pickett wants her to take the shot (praluent) and Crestor together for her cholesterol - It looks like we ended Crestor on July 12. Please advise  Thank you!  [phone] best number for pt

## 2020-12-18 NOTE — Telephone Encounter (Signed)
Reviewed chart, I see that rosuvastatin was rx'd in April and rx was sent with refills for 1 year. I just think that the end date was automatically filled out by the medication program(EPIC) and not truly d/c'd in July. From what I reviewed pt should continue Praluent and Crestor. Pt notified she states that this is what she though. I  will send a refill to the [place] for her

## 2020-12-25 ENCOUNTER — Telehealth: Payer: No Typology Code available for payment source | Admitting: Internal Medicine

## 2020-12-25 ENCOUNTER — Other Ambulatory Visit: Payer: Self-pay

## 2021-01-02 NOTE — Progress Notes (Signed)
[facility] Summary Report / Loop Recorder 

## 2021-01-12 ENCOUNTER — Ambulatory Visit (INDEPENDENT_AMBULATORY_CARE_PROVIDER_SITE_OTHER): Payer: No Typology Code available for payment source

## 2021-01-12 DIAGNOSIS — R55 Syncope and collapse: Secondary | ICD-10-CM

## 2021-01-13 LAB — CUP PACEART REMOTE DEVICE CHECK
Date Time Interrogation Session: 20220825120113
Implantable Pulse Generator Implant Date: 20210921

## 2021-01-26 NOTE — Progress Notes (Signed)
[facility] Summary Report / Loop Recorder 

## 2021-01-29 ENCOUNTER — Ambulatory Visit: Payer: No Typology Code available for payment source | Admitting: Cardiology

## 2021-02-04 ENCOUNTER — Non-Acute Institutional Stay

## 2021-02-04 ENCOUNTER — Inpatient Hospital Stay
Admit: 2021-02-04 | Discharge: 2021-02-06 | Disposition: A | Discharge status: Home / Self Care | Source: Home / Self Care

## 2021-02-04 DIAGNOSIS — I200 Unstable angina: Secondary | ICD-10-CM

## 2021-02-04 DIAGNOSIS — I25110 Atherosclerotic heart disease of native coronary artery with unstable angina pectoris: Secondary | ICD-10-CM

## 2021-02-04 DIAGNOSIS — R0989 Other specified symptoms and signs involving the circulatory and respiratory systems: Secondary | ICD-10-CM

## 2021-02-04 DIAGNOSIS — R9431 Abnormal electrocardiogram [ECG] [EKG]: Secondary | ICD-10-CM

## 2021-02-04 DIAGNOSIS — I10 Essential (primary) hypertension: Secondary | ICD-10-CM

## 2021-02-04 DIAGNOSIS — R079 Chest pain, unspecified: Secondary | ICD-10-CM

## 2021-02-04 DIAGNOSIS — E785 Hyperlipidemia, unspecified: Secondary | ICD-10-CM

## 2021-02-04 DIAGNOSIS — R55 Syncope and collapse: Secondary | ICD-10-CM

## 2021-02-04 DIAGNOSIS — E8881 Metabolic syndrome: Secondary | ICD-10-CM

## 2021-02-04 LAB — Prothrombin Time Panel
INR: 1 s (ref 11.5–14.4)
PROTHROMBIN TIME: 12.5 s (ref 11.5–14.4)

## 2021-02-04 LAB — CREATININE: CREATININE: 0.94 mg/dL (ref 0.60–1.30)

## 2021-02-04 LAB — Troponin I
TROPONIN I: <0.04 ng/mL (ref <0.04)
TROPONIN I: <0.04 ng/mL (ref <0.04)

## 2021-02-04 LAB — Expedited COVID-19 and Influenza A B PCR

## 2021-02-04 LAB — Glucose, Whole Blood: GLUCOSE, WHOLE BLOOD: 179 mg/dL — ABNORMAL HIGH (ref 65–99)

## 2021-02-04 LAB — APTT: APTT: <24 s — ABNORMAL LOW (ref 24.4–36.2)

## 2021-02-04 LAB — BLOOD BANK HOLD SPECIMEN

## 2021-02-04 LAB — Extra Red Top (Plastic)

## 2021-02-04 LAB — UA,Dipstick: PROTEIN: NEGATIVE (ref 1.005–1.030)

## 2021-02-04 LAB — Extra Gold Top

## 2021-02-04 LAB — Urea Nitrogen: UREA NITROGEN: 28 mg/dL — ABNORMAL HIGH (ref 7–22)

## 2021-02-04 LAB — CBC: MEAN CORPUSCULAR VOLUME: 86.1 fL (ref 79.3–98.6)

## 2021-02-04 LAB — COVID-19 PCR: COVID-19 PCR/TMA: NOT DETECTED

## 2021-02-04 LAB — Unfractionated Heparin, by anti-Xa: HEPARIN UNFRACTIONATED: <0.1 [IU]/mL

## 2021-02-04 LAB — Hepatic Funct Panel: ALBUMIN FORHEPFUNCTPNL: 4.3 g/dL (ref 3.9–5.0)

## 2021-02-04 LAB — Electrolyte Panel: POTASSIUM: 4.6 mmol/L (ref 3.6–5.3)

## 2021-02-04 LAB — Blood Lactate: BLOOD LACTATE: 14 mg/dL (ref 5–18)

## 2021-02-04 LAB — TSH with reflex FT4, FT3: TSH: 3.6 u[IU]/mL (ref 0.3–4.7)

## 2021-02-04 MED ADMIN — IOHEXOL 350 MG/ML IV SOLN: 100 mL | INTRAVENOUS | @ 18:00:00 | Stop: 2021-02-04 | NDC 00407141491

## 2021-02-04 MED ADMIN — HEPARIN 25,000 UNITS/250 ML 0.45 NACL RTU - ANTI-XA LOW: 02800 [IU]/h | INTRAVENOUS | @ 23:00:00 | Stop: 2021-02-06 | NDC 00409765052

## 2021-02-04 MED ADMIN — CLOPIDOGREL BISULFATE 300 MG PO TABS: 300 mg | ORAL | @ 22:00:00 | Stop: 2021-02-04

## 2021-02-04 MED ADMIN — ACETAMINOPHEN 325 MG PO TABS: 650 mg | ORAL | @ 22:00:00 | Stop: 2021-02-07 | NDC 00904677361

## 2021-02-04 NOTE — Consults
02/04/2021 1:28 PM    ED CASE MANAGER NOTES    Patient identified by ER team as not transferable, patient has out of network insurance, belongs to [employer],  [facility] notified.    Ebbie Latus, RN, BSN  ED Case Manager  Office : [phone]  [phone]  Fax : [phone]  Pager ID : [phone]

## 2021-02-04 NOTE — H&P
CCU Admission History and Physical   Admitting Team: CCU  Primary Care Physician: Cpn, Unassigned No Record Of Pcp -, MD  Inpatient Attending Physician: Durward Fortes., MD  Resident Physician: Salley Scarlet, MD  Intern Physician: Rosann Auerbach. Ernestina Patches, MD  Date of Service: 02/04/2021     Chief Complaint: Chest Pain (With dizziness )    History of Present Illness   The following history was generated by an interview with the patient as well as a review of medical records.      Monique Wolf is a 65 y.o. female PMH of CAD s/p stent placement x1 (2019) and HTN p/w sudden onset chest discomfort and back pain. She was sitting in a chair [place] her return flight to her home in [place], [place] when she expressed dizziness a/w black spots in her vision and feeling like she was about to pass out. He then felt an 8/10, nonradiating pain located underneath her left shoulder blade. She took SLNG x2 3-5 min apart which reduced her pain to a 6/10. She was evaluated by paramedics, given ASA 325mg  x1, 250cc NS. EKG read acute STEMI and she was BIBA to [facility]. Endorses diaphoresis, denies n/v, SOB, f/c.    Of note, she states that she had a Holter monitor in early 2021 which showed her ''heart stopped 16 times''. She had a loop recorder implanted in 01/2020 with plan for possible pacemaker. Her OP cardiologist is Dr. Gypsy Balsam in [place] [phone]). She reports being on losartan, imdur, ASA 81mg , and 1g ranexa BID. No allergies, no supplements      Emergency Room Course:  ED Triage Vitals   Temp Temp src BP Heart Rate Resp SpO2 O2 Device Pain Score Weight   -- -- 02/04/21 1145 02/04/21 1145 02/04/21 1145 02/04/21 1145 02/04/21 1417 02/04/21 1130 02/04/21 1016     126/87 75 13 96 % None (Room air) Four 65.8 kg (145 lb)       ED Course as of 02/04/21 1526   Wed Feb 04, 2021   1401 CCU resident paged.  [EC]   1405 Admitted to CCU [CO]      ED Course User Index  [CO] Kathrynn Running., MD  [EC] [place], [place] Medication Administration from 02/04/2021 1013 to 02/04/2021 1444         Date/Time Order Dose Route Action Action by Comments     02/04/2021 1107 iohexol (Omnipaque) 350 mg/mL inj 100 mL 97 mL Intravenous Given Gaston Islam F 3.5cc/sec             Trop and delta trop neg. BNP wnl. Lactate wnl. Bedside echo showed trace pericardial effusion w/o WMA or significant decrease in EF. Repeat EKGs showed no concerning signs for STE. CTA pending.    Past Medical History   No past medical history on file.    Home Medications     Prior to Admission medications    Medication Sig Start Date End Date Taking? Authorizing Provider   ISOSORBIDE DINITRATE PO Take by mouth.    PROVIDER, HISTORICAL   LOSARTAN POTASSIUM PO Take by mouth.    PROVIDER, HISTORICAL       Allergies   Not on File    Past Surgical/Admission History     Past Surgical History:   Procedure Laterality Date    CONIZATION CERVIX LOOP      heart stent         Family History   No family history on file.  Social History     Social History     Socioeconomic History    Marital status: Single     Tobacco: None  Alcohol: 1 beer/cocktail every 2 weeks  Recreational/Illicit Drug Use: MJ as a teen but denies any current illicit drug use    Vitals    Heart Rate:  [75-90] 90  Resp:  [12-16] 16  BP: (120-152)/(81-105) 152/105  NBP Mean:  [92-100] 94  SpO2:  [94 %-97 %] 97 %    RT  Oxygen Therapy  SpO2: 97 %  O2 Device: None (Room air)          ABG/VBG:   No results for input(s): PHART, PO2ART, PCO2ART, BICARBART in the last 72 hours.  No results for input(s): PHVEN, PCO2VEN, PO2VEN, BICARBVEN in the last 72 hours.    I/O 24 Hours   No intake or output data in the 24 hours ending 02/04/21 1526    Physical Exam   Physical Exam  Gen: Well-developed in no acute distress.  Eyes: PERRL, sclera anicteric, no conjunctival pallor.  ENT: Moist mucous membranes, oropharynx clear without exudate.  Neck: Supple, trachea midline. No cervical or supraclavicular lymphadenopathy.  CV: Normal rate, regular rhythm. Normal S1 and S2. No murmurs, rubs, gallops.  Pulm: Normal work of breathing on room air. Fair inspiratory effort, fair air movement. Lungs CTAB with no wheezes, crackles, or rhonchi.  Abd: Non-distended. Soft, non-tender to palpation. No appreciable hepatosplenomegaly. Normoactive bowel sounds.  Skin: Warm, no rashes. Mildly diaphoretic  Ext: No cyanosis or edema. Distal pulses 2+ and symmetric.  Neuro: AAOx3, CNII-XII grossly intact. Moving all extremities. Sensation grossly intact.     Labs/Micro/Imaging   CBC:   Recent Labs     02/04/21  1020   WBC 5.19   HGB 13.7   HCT 40.4   MCV 86.1   PLT 241     Lytes:   Recent Labs     02/04/21  1020   NA 138   K 4.6   CL 106   CO2 21   BUN 28*   CREAT 0.94     Estimated Creatinine Clearance: 51.8 mL/min (based on SCr of 0.94 mg/dL).  LFTs:   No results for input(s): TOTPRO, ALBUMIN, BILITOT, BILICON, ALT, AST, ALKPHOS, GGT, AMYLASE, LIPASE in the last 72 hours.    Invalid input(s): BILIDIRECT  Coags:   Recent Labs     02/04/21  1020   INR 1.0   PT 12.5     Lactate:  Recent Labs     02/04/21  1020   LACTATE 14     Other:   Cardiac markers:  Recent Labs     02/04/21  1451 02/04/21  1020   TROPONIN <0.04 <0.04     No results for input(s): DDIMER, CRP, FERRITIN in the last 72 hours.     No results found for: CHOL, CHOLHDL, CHOLDLCAL, CHOLDLQ, TRIGLY  No results found for: TSH, HGBA1C  UA:  No results for input(s): SPECGRAVUR, PHUR, BLDUR, KETONESUR, GLUCOSEUR, PROTCLUR, NITRITEUR, LEUKESTUR, RBCSUR, WBCSUR in the last 72 hours.  MICROBIOLOGY  Recent Results (from the past 24 hour(s))   Expedited COVID-19 and Influenza A B PCR, Respiratory Upper    Collection Time: 02/04/21 10:24 AM    Specimen: Nasopharyngeal; Respiratory, Upper   Result Value Ref Range    Specimen Type Respiratory, Upper     COVID-19 PCR/TMA Not Detected Not Detected    Influenza A PCR Not Detected Not Detected  Influenza B PCR Not Detected Not Detected       IMAGING  CT chest angiogram wo+w contrast    Result Date: 02/04/2021  IMPRESSION: 1.  No thoracic aorta dissection 2.  Severe three-vessel coronary artery calcific atherosclerotic disease 3.  Left ventricular posterior basal wall thickening. Recommend correlation with echocardiography. 4.  Left lower lobe 4 mm solid pulmonary nodule. If patient is low risk no follow-up required although if patient is high risk optional chest CT in 12 months could be considered. 5.  Right renal two non-obstructive calculi. Dictated by: Aura Fey   02/04/2021 12:18 PM Signed by: Porfirio Mylar   02/04/2021 2:58 PM    XR chest ap portable (1 view)    Result Date: 02/04/2021  IMPRESSION:  No acute cardiopulmonary disease. Signed by: Bryan Lemma   02/04/2021 10:30 AM    Assessment & Plan   Marcel Sorter is a 65 y.o. female with history of CAD s/p RCA stent placement x1 (2019) and HTN p/w sudden onset chest discomfort and back pain.    ASSESSMENT    #3v CAD s/p RCA stent (2021)   #Concern for ACS  #Atypical chest pain, POA  #Back pain, POA  TIMI score of 3. Grace score of 83. Non-radiating, 8/10 pressure sensation under mid-back/shoulder blade after hopping off plane -> SLNG x2 -> 6/10, but hypotensive in field to SBP 60s. Given ASA 325mg , 250cc NS. ECG without sig STE. CXR without widened mediastinum, less concern for AD. No PTX. No PE on CTA, Unstable angina vs in stent restenosis. Considered pericarditis given improved with leaning forward; No viral prodrome. Possible transient hypotensive state causing decreased coronary perfusion. 0/10 pain on re-examination. ASA load, no plavix given CP free.  - Contact OP cardiologist  - Start Lipitor 80 daily  - Plan for cath on 9/22  - ASA 81 daily, s/p ASA load  - Heparin gtt 24-48 hrs  - Formal TTE pending  - Loop recorder interrogation  - Continuous telemetry monitoring  - NPO at [place] 9/22    #Pre-syncope, POA  Feeling faint, dizzy a/w diaphoresis without LOC. Loop recorder from home Cardiologist, Positive cardiac hx. On tele. CTA neg for AD, but notable for LV posterior basal wall thickening. Likely 2/2 cardiac etiology.  - Loop recorder interrogation  - See plan above    #C/f sinus pause/SSS/bradycardia, POA  H/o ''heart stopping 16 times'' on Holter monitor in early 2021. PPM considered in past.  - Loop recorder interrogation  - See plan above  - Consider EP consult if concerning findings     #Pericardial effusion, POA  Trace on bedside echo  - Pending formal TTE    #Breast cancer, POA  No active therapy     #Possible DM, POA  Glu 177 on admission  - f/u HgbA1c    #HTN, POA  - Cont home losartan    PPx:  As below    Critical Care Checklist  FEN: Diet regular  Diet NPO Except for: Medications, Sips with Meds  Analgesics: Tylenol PRN  Thromboprophylaxis: heparin gtt  Head of Bed: elevated  Ulcer Prophylaxis: not indicated  Glycemic Control: target 140-180  Bowel Regimen: miralax/senna PRN  Indwelling lines/Airway/Drains:   Peripheral IV 20 G Anterior;Left;Proximal Forearm (0)    No active LDA found  1  No active LDA found  2  Disposition: COU w/ possible cath    Emergency Contact:  Primary Emergency Contact: Catheryn Bacon [phone] (DPOA)  Code Status: Full Code  Patient to be seen and discussed with Dr. Eulah Citizen, Olivia Mackie., MD    Signed:  Kennieth Rad, MD  [facility] Emergency Medicine - PGY1  [phone]  02/04/2021 7:15 PM    I have reviewed the case with the resident and personally examined the pateint. I agree with described assessment and plan. The results, findings, medications were all reviewed and communicated with patient/ caregiver. 40 minutes of critical care was spent with the team, family, interviewing/ examining the patient. This was discussed during multidisciplinary rounds.     Durward Fortes, M.D., F.A.C.C., F.A.C.P.  Professor of Medicine  [facility]  Mechanical Circulatory Support and Heart Transplantation Program  Cardiology Division  [employer] at [facility]

## 2021-02-04 NOTE — ED Provider Notes
[facility]  Emergency Department Service Report    Triage     Monique Wolf, a 65 y.o. female, presents with Chest Pain (With dizziness )    Arrived on 02/04/2021 at 10:13 AM   Arrived by RA 51 [[id number]]    ED Triage Vitals   Temp Temp src BP Heart Rate Resp SpO2 O2 Device Pain Score Weight   -- -- 02/04/21 1145 02/04/21 1145 02/04/21 1145 02/04/21 1145 -- 02/04/21 1130 02/04/21 1016     126/87 75 13 96 %  Four 65.8 kg (145 lb)       Not on File     Initial Physician Contact       Comprehensive Exam Initiated  Contact Date: 02/04/21  Contact Time: 1001    History   HPI   Monique Wolf is a 65 y.o. female with a history of multiple prior syncope, loop recorder in place in her chest (was done at [facility]), who was brought in by EMS from [place] with severe thoracic back pain and hypotension prior to arrival. ENS explains that she took her own nitrates and was found to be hypotensive in the field. Initial two EKG's showed a possible stemi but the 3rd one was without artifact and no signs of a stemi. Once she got here she endorses severe back pain and denied any chest pain or shortness of breath. She was also not hypotensive when she got here. Denies any fevers, nausea or vomiting.          No past medical history on file.     Past Surgical History:   Procedure Laterality Date   ? CONIZATION CERVIX LOOP     ? heart stent          Past Family History   family history is not on file.     Past Social History   She speaks [place].     Review of Systems   Constitutional: Negative for fever.   Gastrointestinal: Negative for nausea and vomiting.   Musculoskeletal: Positive for back pain.   All other systems reviewed and are negative.      Physical Exam   Physical Exam  Vitals and nursing note reviewed.   Constitutional:       General: She is not in acute distress.     Appearance: Normal appearance. She is not ill-appearing.   HENT:      Head: Normocephalic and atraumatic.      Nose: Nose normal.      Mouth/Throat: Mouth: Mucous membranes are moist.   Eyes:      Extraocular Movements: Extraocular movements intact.      Conjunctiva/sclera: Conjunctivae normal.   Cardiovascular:      Rate and Rhythm: Normal rate and regular rhythm.      Pulses: Normal pulses.      Heart sounds: Normal heart sounds.      Comments: Regular rate and rhythm   Pulmonary:      Effort: Pulmonary effort is normal. No respiratory distress.      Breath sounds: Normal breath sounds.      Comments: Clear to auscultation bilaterally   Abdominal:      General: There is no distension.      Palpations: Abdomen is soft.      Tenderness: There is no abdominal tenderness.   Musculoskeletal:         General: No swelling or deformity. Normal range of motion.      Cervical back:  Normal range of motion and neck supple.      Comments: 2+ radial and PT pulses.   Skin:     General: Skin is warm and dry.      Findings: No rash.   Neurological:      Mental Status: She is alert and oriented to person, place, and time.      Comments: Fluent speech.   Psychiatric:         Mood and Affect: Mood normal.         Behavior: Behavior normal.          Medical Decision Making   [name] is a 65 y.o. female with a history of multiple prior syncope, loop recorder in place in her chest (was done at [facility]), who was brought in by EMS from [place] with severe thoracic back pain and hypotension prior to arrival. Initial field EKGs concerning for STEMI, so cardiology team activated and at bedside on arrival. EKG and R-sided EKG wo STE. Query aortic dissection, will CTA. Plan for admit to CCU if CTA negative given high risk CP.    Chart Review   Previous medical records requested.  Pertinent items reviewed.    ED Course      Laboratory Results     Labs Reviewed   UREA NITROGEN - Abnormal; Notable for the following components:       Result Value    Urea Nitrogen 28 (*)     All other components within normal limits   GLUCOSE - Abnormal; Notable for the following components:    Glucose 179 (*) All other components within normal limits   ELECTROLYTE PANEL - Normal   LACTATE - Normal   PROTHROMBIN TIME PANEL - Normal   TROPONIN I - Normal   EXPEDITED COVID-19 AND INFLUENZA A B PCR, RESPIRATORY UPPER    Narrative:     This test is intended for in vitro diagnostic use under FDA Emergency Use Authorization only. Results are for the presumptive identification of COVID-19, Influenza A, and Influenza B RNA. The [facility] is certified under the Clinical Laboratory Improvement Amendments of 1988 (CLIA-88) as qualified to perform high complexity clinical laboratory testing.   COVID-19 PCR, RESPIRATORY UPPER   CBC   CREATININE,WHOLE BLOOD   RAINBOW DRAW TO LABORATORY    Narrative:     The following orders were created for panel order [name] (ED Adult Blood Draw: Nurse Protocol).  Procedure                               Abnormality         Status                     ---------                               -----------         ------                     Extra Red Top (Plastic)[[id number]]                          Final result               Pink Top-Blood Bank Hold.Marland KitchenMarland Kitchen[[id number]]  Final result               Extra Gold [id number]                                   Final result                 Please view results for these tests on the individual orders.   EXTRA RED TOP (PLASTIC)   PINK TOP-BLOOD BANK HOLD SPECIMEN   EXTRA GOLD TOP   UA,DIPSTICK       Imaging Results     CT chest angiogram wo+w contrast   Preliminary Result by Aura Fey, MD (09/21 1305)   IMPRESSION:      1.  No thoracic aorta dissection.   2.  Severe three-vessel coronary artery calcific atherosclerotic disease.   3.  Left ventricular posterior basal wall thickening. Recommend correlation with echocardiography.   4.  Left lower lobe 4 mm solid pulmonary nodule. If patient is low risk no follow-up required although if patient is high risk optional chest CT in 12 months could be considered.   5.  Right renal two non-obstructive calculi.      Dictated by: Aura Fey   02/04/2021 12:18 PM      XR chest ap portable (1 view)   Final Result by Bryan Lemma, MD (09/21 1030)   IMPRESSION:        No acute cardiopulmonary disease.            Signed by: Bryan Lemma   02/04/2021 10:30 AM          Consults     ED CM Consult Completed    Date and Time Initial Review Complete User   02/04/21 1326 Completed BPD          Time               Service    Progress Notes / Reassessments     ED Course as of 02/04/21 1453   Wed Feb 04, 2021   1401 CCU resident paged.  [EC]   1405 Admitted to CCU [CO]      ED Course User Index  [CO] Kathrynn Running., MD  [EC] Virgie Dad [facility]         ED Procedure Notes     Any ED procedures performed are documented on separate ED procedure notes.    Clinical Impression     1. Chest pain        Disposition and Follow-up   Disposition: Admit [3]       No future appointments.    Follow up with:  No follow-up provider specified.    Return precautions are specified on After Visit Summary.    New Prescriptions    No medications on file       Orders Placed This Encounter   ? Expedited COVID-19 and Influenza A B PCR, Respiratory Upper   ? COVID-19 PCR, Nasopharyngeal   ? XR chest ap portable (1 view)   ? XR chest ap portable (1 view)   ? CT chest angiogram wo+w contrast   ? CBC without differential   ? Electrolyte Panel (Na, K, Cl, CO2)   ? Urea Nitrogen   ? Creatinine   ? Glucose   ? Lactate   ? Prothrombin time (PT/INR)   ? Troponin   ? UA dipstick   ? Rainbow Draw (ED  Adult Blood Draw: Nurse Protocol)   ? Extra [employer] (Plastic)   ? Pink Top-Blood Bank Hold Specimen   ? Extra Gold Top   ? Cardiac monitoring   ? Vital signs per unit routine   ? Activity as tolerated   ? Full Code   ? Isolation Status: Add: Enhanced Droplet and Contact; Remove: N/A   ? ECG, 12 lead - chest pain   ? ECG, 12 lead   ? EKG REPORT   ? ED 12 LEAD ECG    ? ED 12 LEAD ECG    ? ED 12 LEAD ECG    ? Insert peripheral IV   ? Saline lock IV   ? Plan to Admit   ? Bed Request   ? ISOSORBIDE DINITRATE PO   ? LOSARTAN POTASSIUM PO   ? iohexol (Omnipaque) 350 mg/mL inj 100 mL       Scribe Signature   I, [employer], have acted as a Stage manager for [employer] on behalf of Dr. Rubbie Battiest on 02/04/2021 at 1:41 PM.    Physician Signature(s)   I have reviewed this note as recorded by [facility], who acted as medical scribe, and I attest that it is an accurate representation of my H&P and other events of the ED visit except as otherwise noted.            Kathrynn Running., MD  Resident  02/04/21 [phone]

## 2021-02-04 NOTE — Hospital Course
Patient reports that she was [place] the airport [place] when she suddenly felt chest discomfort/back pain characterized as 8/10, nonradiating, located underneath her left shoulder blade. She then took [facility] x2 3-71min apart, which reportedly improved her discomfort to 6/10. While she walked to the flight terminal desk to ask for some water, she expressed dizziness a/w starting to seeing black spots and felt like she was about to pass out. No associated n/v, tongue lac. At that time, she was evaluated by paramedics. Given ASA 325mg  x1, 250cc NS in field. ECG read acute STEMI and patient was BIBA to [facility].     No reported stressors.      Reports that she underwent coronary stent x1 placement in 2019. Her Cardiologist is Dr. 2020 in [place], [place] [phone]). Reports being on losartan, imdur, ASA 81mg , and 1g ranexa BID. No allergies, no supplements.     Of note, patient had her loop recorded implanted ~01/2020 after she was told that her ''heart stopped 16 times'' with plan for possible pacemaker after a Hoelter monitor in early 01/2020.    Drinks 1 beer/cocktail every 2 weeks. Smoked MJ as a teen, no other illicit drug use.       On re-evaluation, she endorses 0/10 chest discomfort.       Concern for ACS and possible ISR given remote coronary stent placement.

## 2021-02-05 ENCOUNTER — Non-Acute Institutional Stay: Payer: PRIVATE HEALTH INSURANCE

## 2021-02-05 LAB — Basic Metabolic Panel
TOTAL CO2: 24 mmol/L (ref 20–30)
UREA NITROGEN: 22 mg/dL (ref 7–22)

## 2021-02-05 LAB — Magnesium: MAGNESIUM: 1.8 meq/L (ref 1.4–1.9)

## 2021-02-05 LAB — Unfractionated Heparin, by anti-Xa
HEPARIN UNFRACTIONATED: 0.15 [IU]/mL
HEPARIN UNFRACTIONATED: 0.45 [IU]/mL
HEPARIN UNFRACTIONATED: 0.47 [IU]/mL

## 2021-02-05 LAB — Hgb A1c: HGB A1C - HPLC: 5.2 (ref <5.7)

## 2021-02-05 LAB — Differential Automated: ABSOLUTE NEUT COUNT: 3.38 10*3/uL (ref 1.80–6.90)

## 2021-02-05 LAB — CBC: RED CELL DISTRIBUTION WIDTH-CV: 13.2 (ref 11.1–15.5)

## 2021-02-05 LAB — B-Type Natriuretic Peptide: BNP: 14 pg/mL (ref <100)

## 2021-02-05 MED ADMIN — ATORVASTATIN CALCIUM 80 MG PO TABS: 80 mg | ORAL | @ 04:00:00 | Stop: 2021-02-07 | NDC 00904629304

## 2021-02-05 MED ADMIN — RANOLAZINE ER 500 MG PO TB12: 1000 mg | ORAL | @ 16:00:00 | Stop: 2021-02-07 | NDC 60687054911

## 2021-02-05 MED ADMIN — ASPIRIN 81 MG PO CHEW: 81 mg | ORAL | @ 16:00:00 | Stop: 2021-02-07 | NDC 63739043402

## 2021-02-05 MED ADMIN — HEPARIN 25,000 UNITS/250 ML 0.45 NACL RTU - ANTI-XA LOW: 02800 [IU]/h | INTRAVENOUS | @ 07:00:00 | Stop: 2021-02-06

## 2021-02-05 MED ADMIN — RANOLAZINE ER 500 MG PO TB12: 1000 mg | ORAL | @ 06:00:00 | Stop: 2021-02-07 | NDC 60687054911

## 2021-02-05 MED ADMIN — ACETAMINOPHEN 325 MG PO TABS: 650 mg | ORAL | @ 20:00:00 | Stop: 2021-02-07 | NDC 57896010410

## 2021-02-05 MED ADMIN — HEPARIN 25,000 UNITS/250 ML 0.45 NACL RTU - ANTI-XA LOW: 02800 [IU]/h | INTRAVENOUS | @ 03:00:00 | Stop: 2021-02-06

## 2021-02-05 MED ADMIN — BUSPIRONE HCL 5 MG PO TABS: 5 mg | ORAL | @ 19:00:00 | Stop: 2021-02-07 | NDC 51079098501

## 2021-02-05 NOTE — Consults
SPRITUAL CARE CONSULTATION NOTE    PATIENT:  Monique Wolf  MRN:  [id number]     Patient Info        Religious/Spiritual Identity:        Ephriam Knuckles       Last Anointed Date:                 Baptised:                 Spiritual Care Visit Details              Date of Visit:  02/05/21  Time of Visit:  1500  Visited with Patient   Visit length 15 Minutes   Referral source Self-initiated   Reason for visit Initial visit/assessment      Spiritual Assessment     Spiritual practices & resources Chaplain visits, Family/Friends, Prayer, Personal faith/Spiritual beliefs   Areas of spiritual/emotional distress Adjustment to illness/hospitalization, Concerns for health and healing, Feelings of ..., Fear of health care procedures   Distressful feelings Feelings of anxiety/worry (Specify)   Indicators of spiritual wellbeing Able to give love and support, Able to receive love and support, Expresses..., Trust in [employer], Demonstrates resilience, Systems developer and positive actions   Expressions of spiritual wellbeing Expresses acceptance, Expresses courage, Expresses desire to get well, Expresses gratitude, Expresses hope      Plan     Spiritual care intervention Active Listening, Building trust, Ministry of presence, Explored feelings related to present illness, Dance movement psychotherapist   Outcomes (per patient/family) Appreciated visit   Spiritual care plans Continue to visit as needed   Additional comments .      Recommendation           Author:  Tawni Levy 02/05/2021 3:50 PM  Contact info: RR pager: [phone] ext: [phone]

## 2021-02-05 NOTE — Progress Notes
Pharmaceutical Services - Admission Medication Reconciliation Note      Patient Name: Monique Wolf  Medical Record Number: [id number]  Admit date: 02/04/2021 2:44 PM  Age: 65 y.o.  Sex: female  Allergies: Not on File    New allergies: Lipitor- Joint Pain  Triamterene-Hydrochlorothiazide- ''it caused me heart problems''    Height:   Most recent documented height   02/04/21 1.524 m (5')     Actual Weight:   Most recent documented weight   02/05/21 69.8 kg     Diagnosis: The patient is currently admitted with the following concerns/issues: Active Problems:    Chest pain POA: Yes    Pre-syncope POA: Yes    Reported Medication History   I spoke with the patient at bedside to update the home medication list for this hospital admission. All medications are self-reported only - not found in Surescripts; unable to verify with a secondary source currently.    Patient noted the pharmacy uses is [facility], but they are currently closed.  [phone]    PTA Medication List (discrepancies are noted)   Medications Prior to Admission   Medication Sig Last Dose    albuterol 90 mcg/act inhaler Take 1 puff by nebulization every six (6) hours as needed for Shortness of Breath or Wheezing.     Alirocumab (PRALUENT) 150 MG/ML injection pen Inject 150 mg under the skin every fourteen (14) days. 01/29/2021    aspirin 81 mg EC tablet Take 81 mg by mouth daily. 02/04/2021    busPIRone 5 mg tablet Take 5 mg by mouth three (3) times daily as needed for Anxiety.     isosorbide mononitrate 120 mg 24 hr tablet Take 120 mg by mouth daily.     losartan 100 mg tablet Take 100 mg by mouth daily.     nitroglycerin 0.4 mg SL tablet Place 0.4 mg under the tongue every five (5) minutes as needed for Chest pain.     ranolazine 1000 mg 12 hr tablet Take 1,000 mg by mouth two (2) times daily. 02/04/2021    rosuvastatin 5 mg tablet Take 5 mg by mouth daily.           Patient was not taking medications listed below prior to admission: ISOSORBIDE DINITRATE PO Take by mouth. duplicate    LOSARTAN POTASSIUM PO Take by mouth. duplicate     Discharge Prescription Preference:   [facility] [id number]  [address]  [address]  [place] [address]    The patient's allergies and medications have been reviewed and updated.     Barrington Ellison, Medication Reconciliation Pharmacy Technician, 02/05/2021, 3:47 PM          -------------------------------------------------------------------------------------------------------------------------------------------  I reviewed the prior to admission medication list compiled by the medication reconciliation pharmacy technician and attest to the above.    The reconciliation of admission orders with the prior to admission medication list is complete. Losartan & isosorbide mononitrate, not yet resumed at this time; will defer to team to resume when clinically appropriate.    This note represents our good faith effort to obtain the best possible medication history from all attainable sources & based on patient/family/caregiver's ability/willingness to provide information at the time of reconciliation.    Kaleen Mask, PharmD, APh, BCPS  Medication Reconciliation Pharmacist  Phone # [phone]; Pager [phone]

## 2021-02-05 NOTE — ED Notes
Report received from Roshana, RN

## 2021-02-05 NOTE — H&P
UPDATED H&P REQUIREMENT    For [place] [facility] and [facility]    WHAT IS THE STATUS OF THE PATIENT'S MOST CURRENT HISTORY AND PHYSICAL?   - The most current H&P is >24 hours and <30 days, and having examined the patient, I attest that there have been no changes. (This suffices as an update to the H&P).      REFER TO MEDICAL STAFF POLICIES REGARDING PRE-PROCEDURE HISTORY AND PHYSICAL EXAMINATION AND UPDATED H&P REQUIREMENTS BELOW:    [place] [facility] and [facility] Medical Staff Policy 200 - For Patients Undergoing Procedures Requiring Moderate or Deep Sedation, General Anesthesia or Regional Anesthesia    Contents of a History and Physical Examination (H&P):    The H&P shall consist of chief complaint, history of present illness, allergies and medications, relevant social and family history, past medical history, review of systems and physical examination, and assessment and plan appropriate to the patient's age.    For Patients Undergoing Procedures Requiring Moderate or Deep Sedation, General Anesthesia or Regional Anesthesia:    1. An H&P shall be performed within 24 hours prior to the procedure by a qualified member of the medical staff or designee with appropriate privileges, except as noted in item 2 below.    2. If a complete history and physical was performed within thirty (30) calendar days prior to the patient's admission to the Medical Center for elective surgery, a member of the medical staff assumes the responsibility for the accuracy of the clinical information and will need to document in the medical record within twenty-four (24) hours of admission and prior to surgery or major invasive procedure, that they either attest that the history and physical has been reviewed and accepted, or document an update of the original history and physical relevant to the patient's current  clinical status.    3. Providing an H&P for patients undergoing surgery under local anesthesia is at the discretion of the Attending Physician.     4. When a procedure is performed by a dentist, podiatrist or other practitioner who is not privileged to perform an H&P, the anesthesiologist's assessment immediately prior to the procedure will constitute the 24 hour re-assessment.The dentist, podiatrist or other practitioner who is not privileged to perform an H&P will document the history and physical relevant to the procedure.    5. If the H&P and the written informed consent for the surgery or procedure are not recorded in the patient's medical record prior to surgery, the operation shall not be performed unless the attending physician states in writing that such a delay could lead to an adverse event or irreversible damage to the patient.    6. The above requirements shall not preclude the rendering of emergency medical or surgical care to a patient in dire circumstances.

## 2021-02-05 NOTE — Other
Patient's Clinical Goal:   Clinical Goal(s) for the Shift: stable vss, hemodynamically stable, promote safety and comfort, heparin gtt, monitor any s/s cardiac related chest pain  Identify possible barriers to advancing the care plan: none  Stability of the patient: Moderately Stable - low risk of patient condition declining or worsening   Progression of Patient's Clinical Goal:     -A&OX4. Afebrile. Denies of any cardiac related chest pain or SOB.    -NSR 70-80s. SBP 120-150s. SpO2 94%> RA.   -AUOP via BRP  -Last BM 9/21, Diet ordered @ 1600 to low fat, NPO p MN tonight  -Skin is intact; no signs of irritation/ inflammation/ s/s of infections.   - Heparin gtt titrated per Q6 Xa levels.   - TTE done, pending read, Cath lab deferred until tomorrow, Covid swab sent to lab  - Hourly Ps rounded by RN. RN ensured low bed position in break, fall/ safety/ aspiration precaution. Endorsed the next plan of care and report to the oncoming nurse.

## 2021-02-05 NOTE — Progress Notes
IP CM ACTIVE DISCHARGE PLANNING  Department of Care Coordination      Admit [phone]  Anticipated Date of Discharge: 02/06/2021    Following NO:BSJG, Olivia Mackie., MD      Today's short update     (P) As reported by the physician, the patient experienced sudden onset chest discomfort and back pain. As she sat in a chair at [place] waiting for her return flight to [place], [place], she expressed dizziness and black spots in her vision, feeling as if she was about to fall asleep. Pending work up.    Disposition     (P) Home  (P) [address]  [place] [address]  Family/Support System in agreement with the current discharge plan: (P) Yes, in agreement and participating (Patients nephew will pick her up once stable for DC.)      Christen Wardrop [name]  DNP (c) MSNed RN [facility]   MEDSURG-BC CMGT-BC    Department of Care Coordination and Clinical Social Work   Medicine Case Manager  Primary Team: CCU  T: [phone]  C: [phone]  Pager: [phone]  [place]: [phone]  Email  [email or web address]      --------------------------------------------------------------------------------------------------------------------------  For Weekend/Swing shift Case Management assistance contact ([phone])    For assistance after hours:    Swing Case Manager: [name] RN / Work cell: [phone])  Swing Case Manager: [name] RN / Work cell: [phone])  ER Case Manager: [phone]) 24/7

## 2021-02-05 NOTE — Progress Notes
CCU/COU Progress Note     Name: Monique Wolf  Medical Record Number: [id number]  Date of Admission: 02/04/2021    Date of Service: 02/05/2021  Days Hospitalized:  LOS: 1 day     PCP:Cpn, Unassigned No Record Of Pcp -, MD  Chief Complaint:   Chief Complaint   Patient presents with    Chest Pain     With dizziness      Reason for Continued Admission: LHC    24-hour/Overnight Events     Pt endorsed recurrence of back pain/tightness overnight (5/10) although she states it was much less than when she presented (10/10). Denies nausea, diaphoresis or SOB. Pt NPO at MN for planned LHC.    Subjective     Overall feels well and is pain free despite overnight event. Expresses some anxiety over planned LHC today. No new complaints at this time. Denies SOB, n/v, f/c.    Inpatient Medications     Scheduled Meds:   aspirin  81 mg Oral Daily    atorvastatin  80 mg Oral Daily    busPIRone  5 mg Oral TID    ranolazine  1,000 mg Oral BID       Continuous Infusions:   heparin 25,000 units/250 mL drip (Anti-Xa UFH Low Intensity) 950 Units/hr (02/04/21 2356)       PRN Meds:  acetaminophen **OR** acetaminophen suppository, acetaminophen, aluminum-magnesium hydroxide-simethicone, bisacodyl, ondansetron injection/IVPB, polyethylene glycol, senna    Physical Exam     Patient Vitals for the past 24 hrs:   BP Temp Temp src Pulse Resp SpO2 Weight   02/05/21 1105 129/83 36.4 ?C (97.5 ?F) Oral 88 18 95 % --   02/05/21 0740 136/94 36.9 ?C (98.4 ?F) Oral 82 23 95 % --   02/05/21 0611 128/84 36.7 ?C (98.1 ?F) Oral 78 17 96 % 69.8 kg (153 lb 12.8 oz)   02/05/21 0500 -- -- -- -- -- -- 69.6 kg (153 lb 7 oz)   02/04/21 2358 129/86 36.5 ?C (97.7 ?F) Oral 79 17 96 % --   02/04/21 2003 155/91 36.6 ?C (97.9 ?F) Oral 89 15 95 % --   02/04/21 1930 -- -- -- 80 12 93 % --   02/04/21 1815 137/90 -- -- 89 21 95 % --   02/04/21 1500 129/98 -- -- 84 20 95 % --     Last Recorded Vital Signs:    02/05/21 1105   BP: 129/83   Pulse: 88   Resp: 18   Temp: 36.4 ?C (97.5 ?F) SpO2: 95%       Intake/Output Summary (Last 24 hours) at 02/05/2021 1452  Last data filed at 02/05/2021 1200  Gross per 24 hour   Intake 477.43 ml   Output 600 ml   Net -122.57 ml      Yesterday's I/O summary:  09/21 0701 - 09/22 0700  In: 296 [P.O.:280]  Out: 600 [Urine:600]    Wt Readings from Last 1 Encounters:   02/05/21 0611 69.8 kg (153 lb 12.8 oz)   02/05/21 0500 69.6 kg (153 lb 7 oz)   02/04/21 1016 65.8 kg (145 lb)       Co-oximetry (noting oxyhemoglobin percent as mixed venous oxygen saturation)  Invalid input(s): OXYHEMOGLOBIN, HEMOGLOBIN,  COOX, CARBOXYHEMOGLOBIN, METHEMOGLOBIN    Respiratory  Oxygen Therapy  SpO2: 95 %  O2 Device: None (Room air)     Physical Exam  Gen: Well-developed in no acute distress.  Eyes: PERRL, sclera anicteric, no conjunctival pallor.  ENT: Moist mucous membranes, oropharynx clear without exudate.  Neck: Supple, trachea midline. No cervical or supraclavicular lymphadenopathy.  CV: Normal rate, regular rhythm. Normal S1 and S2. No murmurs, rubs, gallops.  Pulm: Normal work of breathing on room air. Fair inspiratory effort, fair air movement. Lungs CTAB with no wheezes, crackles, or rhonchi.  Abd: Non-distended. Soft, non-tender to palpation. No appreciable hepatosplenomegaly. Normoactive bowel sounds.  Skin: Warm and dry, no rashes.  Ext: No cyanosis or edema. Distal pulses 2+ and symmetric.  Neuro: AAOx3, CNII-XII grossly intact. Moving all extremities. Sensation grossly intact    Labs     BMP/Electrolytes (six most recent values in last 72 hours)  Trended Labs     02/05/21  0556 02/04/21  1020   NA 136 138   K 4.6 4.6   CL 102 106   CO2 24 21   BUN 22 28*   CREAT 0.87 0.94   GLUCOSE 103* 179*   ANIONGAP 10 11   CALCIUM 9.7  --    MG 1.8  --        CBC Highlights (six most recent values in last 72 hours)  Trended Labs     02/05/21  0556 02/04/21  1020   WBC 7.14 5.19   HCT 41.7 40.4   MCV 87.2 86.1   PLT 247 241       Iron panel (six most recent values within last year)  No results for input(s): FERRITIN, FE, TIBC, FEBINDSAT in the last 8760 hours.     Liver chemistries (six most recent values in last 72 hours)  Trended Labs     02/04/21  1034   TOTPRO 6.5   ALBUMIN 4.3   BILITOT 0.5   BILICON <0.2   ALT 20   AST 19   ALKPHOS 64       Coags (six most recent values in last 72 hours)  Trended Labs     02/05/21  1307 02/05/21  0556 02/04/21  2238 02/04/21  1512 02/04/21  1020   PT  --   --   --  13.3 12.5   INR  --   --   --  1.0 1.0   HEPANTIXA 0.47 0.45 0.15 <0.10  --        Cardiac (nine most recent values within last year)  Trended Labs     02/04/21  2238 02/04/21  1451 02/04/21  1020   TROPONIN  --  <0.04 <0.04   BNP 14  --   --        Lipids (six most recent values within last year)  No results for input(s): CHOL, CHOLHDL, TRIGLY in the last 8760 hours.    Invalid input(s): CHOLLDLCALC     Blood gases (six most recent values in last 72 hours)  No results for input(s): PHVEN, PCO2VEN, BEVEN in the last 72 hours.  No results for input(s): PHART, PCO2ART, PO2ART, BEART, O2SATART, FIO2ART in the last 72 hours.    Endocrine (nine most recent values within last year)  Trended Labs     02/04/21  1020   HGBA1C 5.2     Trended Labs     02/04/21  1034   TSH 3.6       Vitamins (six most recent values within the last year)  No results for input(s): VITAMINB1, VITAMINB6, VITAMINB12, FOLATE, VITD25OH, VITAMINE in the last 8760 hours.       Cardiodiagnostics     Results for orders placed or  performed during the hospital encounter of 02/04/21   ECG 12 lead   Result Value Ref Range    Ventricular Rate 76 BPM    Atrial Rate 77 BPM    P-R Interval 155 ms    QRS Duration 95 ms    Q-T Interval 413 ms    QTC Calculation (Bezet) 465 ms    P Axis 34 degrees    R Axis 67 degrees    T Axis 23 degrees    Diagnosis Sinus rhythm     Diagnosis Anteroseptal infarct, age indeterminate     Diagnosis Abnormal ECG     Diagnosis      Diagnosis       Confirmed by [facility] MD, HOLLY ([id number]) on 02/05/2021 9:44:51 AM Additional relevant imaging     All imaging from last 24 hours   No imaging has been resulted in the last 24 hours    Microbiology     Urinalysis (seven most recent values in last week)  Trended Labs     02/04/21  1518   PHUR 6.5   SPECGRAVUR 1.049*   BLDUR Negative   BILIUR Negative   KETONESUR Negative   GLUCOSEUR Negative   PROTCLUR Negative   LEUKESTUR 1+*   NITRITEUR Negative     Bacterial cultures (seven most recent values in last week)  No results for input(s): BACULBLD, BACULUR, BACULRSP in the last 168 hours.    Fungal cultures (seven most recent values in last week)  No results for input(s): FUCUL, FUCULBLD, FUCULUR, FUCULRSP in the last 168 hours.    Assessment & Plan     Monique Wolf is a 65 y.o. female with history of CAD s/p RCA stent placement x1 (2019) and HTN p/w sudden onset chest discomfort and back pain.     ASSESSMENT     #3v CAD s/p LCx stent (2017)   #Concern for ACS  #Atypical chest pain, POA  #Back pain, POA  TIMI score of 3. Grace score of 83. Non-radiating, 8/10 pressure sensation under mid-back/shoulder blade after hopping off plane -> SLNG x2 -> 6/10, but hypotensive in field to SBP 60s. Given ASA 325mg , 250cc NS. ECG without sig STE. CXR without widened mediastinum, less concern for AD. No PTX. No PE on CTA, Unstable angina vs in stent restenosis. Considered pericarditis given improved with leaning forward; No viral prodrome. Possible transient hypotensive state causing decreased coronary perfusion. 0/10 pain on re-examination. ASA load, no plavix given CP free.  - Contacted OP cardiologist   - cell [phone]   - Medtronic Reveal Linq loop recorder   - Multiple presyncope events in the past, no captures on loop recorder. Last interrogation was April 2022.   - Stent of LCx in 2017  - Start Lipitor 80 daily  - Plan for cath most likely on 9/23  - ASA 81 daily, s/p ASA load  - Heparin gtt 24-48 hrs  - Formal TTE pending  - Loop recorder interrogation performed on 9/22  - Continuous telemetry monitoring  - NPO at MN 9/23     #Pre-syncope, POA  Feeling faint, dizzy a/w diaphoresis without LOC. Loop recorder from home Cardiologist, Positive cardiac hx. On tele. CTA neg for AD, but notable for LV posterior basal wall thickening. Likely 2/2 cardiac etiology.  - Loop recorder interrogation performed  - See plan above     #C/f sinus pause/SSS/bradycardia, POA  H/o ''heart stopping 16 times'' on Holter monitor in early 2021. PPM considered in past.  -  Loop recorder interrogation performed, no concern findings  - See plan above     #Pericardial effusion, POA  Trace on bedside echo  - Pending formal TTE     #Breast cancer, POA  No active therapy      #Possible DM, POA  Glu 177 on admission  - f/u HgbA1c     #HTN, POA  - Hold home losartan    #Anxiety, POA  - Restarted home Buspar 5mg  TID     PPx:  As below     Critical Care Checklist  FEN: Diet regular  Diet NPO Except for: Medications, Sips with Meds  Analgesics: Tylenol PRN  Thromboprophylaxis: heparin gtt  Head of Bed: elevated  Ulcer Prophylaxis: not indicated  Glycemic Control: target 140-180  Bowel Regimen: miralax/senna PRN  Indwelling lines/Airway/Drains:   Peripheral IV 20 G Anterior;Left;Proximal Forearm (0)     No active LDA found  1  No active LDA found  2  Disposition: COU w/ possible cath     Emergency Contact:  Primary Emergency Contact: Catheryn Bacon [phone] (DPOA)  Code Status: Full Code    Signatures     Discussed with Attending Cardiologist Durward Fortes., MD and the team    Author:   Kennieth Rad, MD  [facility] Emergency Medicine - PGY1  [phone]  02/05/2021 2:53 PM    I have reviewed the case with the resident and personally examined the pateint. I agree with described assessment and plan. The results, findings, medications were all reviewed and communicated with patient/ caregiver. 40 minutes of critical care was spent with the team, family, interviewing/ examining the patient. This was discussed during multidisciplinary rounds.     Durward Fortes, M.D., F.A.C.C., F.A.C.P.  Professor of Medicine  [facility]  Mechanical Circulatory Support and Heart Transplantation Program  Cardiology Division  [employer] at [facility]

## 2021-02-05 NOTE — Procedures
SEE MEDIA SECTION FOR PROGRAMMER PRINTOUT    CARD EP Device Clinic Pacemaker     PATIENT: Monique Wolf  MRN: [id number]   DOB: 02-19-1956     DATE OF PROCEDURE: 02/05/2021   TIME OF PROCEDURE: 2836    INDICATIONS: Chest pain     PROCEDURE: Loop recorder eval    Location: [facility] [place] [address]    Technician: Shanon Payor, RN    READING PHYSICIAN: Dr. Willey Blade       Device:  Device Manufacturer: Medtronic  Device Name: Reveal Linq II  Device Model Number: [id number]  Device Serial Number: [id number]  Implant Date: 02/05/2020  Indication for Implant: Syncope    Battery: Good    Underlying Rhythm: Sinus rhythm     Since 01/31/2021    Episode summary:  Symptoms: 0   Tachy: 0  Pause: 0   Brady: 0  AT/AF: 0  AT/AF burden: 0     Programmed changes:   None    Assessment  1. Reveal with normal function.   2. No episodes noted.    Plan:  1. No programming changes made.  2. Educated patient on importance of activating for symptoms.    Shanon Payor, RN

## 2021-02-05 NOTE — Other
Patient's Clinical Goal:   Clinical Goal(s) for the Shift: stable vss, hemodynamically stable, promote safety and comfort, heparin gtt, monitor any s/s cardiac related chest pain  Identify possible barriers to advancing the care plan: N/A  Stability of the patient: Moderately Stable - low risk of patient condition declining or worsening   Progression of Patient's Clinical Goal:   Pt came fr/ ER @2000 .    -AAOX4. Afebrile. Denies of any cardiac related chest pain or SOB.    -NSR . SBP 120-150s. SpO2 94%> RA.   -Voids successfully with no urinary symptoms.   -Last BM 9/21  -Skin is intact; no signs of irritation/ inflammation/ s/s of infections.   -NPO @0000 .  - Heparin gtt managed.   - Next plan of care:  Possible cath on 9/22.   - Hourly Ps rounded by RN. RN ensured low bed position in break, fall/ safety/ aspiration precaution. Endorsed the next plan of care and report to the oncoming nurse.

## 2021-02-06 ENCOUNTER — Non-Acute Institutional Stay: Payer: PRIVATE HEALTH INSURANCE

## 2021-02-06 LAB — Magnesium: MAGNESIUM: 1.7 meq/L (ref 1.4–1.9)

## 2021-02-06 LAB — Basic Metabolic Panel
POTASSIUM: 4.3 mmol/L (ref 3.6–5.3)
TOTAL CO2: 23 mmol/L (ref 20–30)

## 2021-02-06 LAB — Differential Automated: ABSOLUTE MONO COUNT: 0.52 10*3/uL (ref 0.20–0.80)

## 2021-02-06 LAB — COVID-19 PCR

## 2021-02-06 LAB — CBC: NUCLEATED RBC%, AUTOMATED: 0 (ref 11.6–15.2)

## 2021-02-06 LAB — Unfractionated Heparin, by anti-Xa
HEPARIN UNFRACTIONATED: 0.43 [IU]/mL
HEPARIN UNFRACTIONATED: 0.45 [IU]/mL

## 2021-02-06 MED ORDER — RANOLAZINE ER 1000 MG PO TB12
1000 mg | ORAL_TABLET | Freq: Two times a day (BID) | ORAL | 0 refills | 1 days | Status: AC
Start: 2021-02-06 — End: ?

## 2021-02-06 MED ORDER — BUSPIRONE HCL 5 MG PO TABS
5 mg | ORAL_TABLET | Freq: Three times a day (TID) | ORAL | 0 refills | 30.00 days | Status: AC | PRN
Start: 2021-02-06 — End: 2021-02-07

## 2021-02-06 MED ORDER — ASPIRIN 81 MG PO TBEC
81 mg | ORAL_TABLET | Freq: Every day | ORAL | 0 refills | 30.00 days | Status: AC
Start: 2021-02-06 — End: 2021-02-07
  Filled 2021-02-07: qty 1, 1d supply, fill #0

## 2021-02-06 MED ORDER — LOSARTAN POTASSIUM 100 MG PO TABS
100 mg | ORAL_TABLET | Freq: Every day | ORAL | 0 refills | 7.00 days | Status: AC
Start: 2021-02-06 — End: 2021-02-07
  Filled 2021-02-07: qty 1, 1d supply, fill #0

## 2021-02-06 MED ORDER — ASPIRIN 81 MG PO TBEC
81 mg | ORAL_TABLET | Freq: Every day | ORAL | 0 refills | 1.00000 days | Status: AC
Start: 2021-02-06 — End: ?

## 2021-02-06 MED ORDER — ATORVASTATIN CALCIUM 80 MG PO TABS
80 mg | ORAL_TABLET | Freq: Every day | ORAL | 0 refills | 1.00000 days | Status: AC
Start: 2021-02-06 — End: ?
  Filled 2021-02-07: qty 1, 1d supply, fill #0

## 2021-02-06 MED ORDER — ALBUTEROL SULFATE HFA 108 (90 BASE) MCG/ACT IN AERS
1 | Freq: Four times a day (QID) | RESPIRATORY_TRACT | 0 refills | Status: AC | PRN
Start: 2021-02-06 — End: 2021-02-07

## 2021-02-06 MED ORDER — ISOSORBIDE MONONITRATE ER 120 MG PO TB24
120 mg | ORAL_TABLET | Freq: Every day | ORAL | 0 refills | 7.00000 days | Status: AC
Start: 2021-02-06 — End: 2021-02-07
  Filled 2021-02-07: qty 1, 1d supply, fill #0

## 2021-02-06 MED ORDER — RANOLAZINE ER 1000 MG PO TB12
1000 mg | ORAL_TABLET | Freq: Two times a day (BID) | ORAL | 0 refills | 30.00 days | Status: AC
Start: 2021-02-06 — End: 2021-02-07
  Filled 2021-02-07: qty 1, 1d supply, fill #0

## 2021-02-06 MED ORDER — RANOLAZINE ER 1000 MG PO TB12
1000 mg | ORAL_TABLET | Freq: Two times a day (BID) | ORAL | 0 refills | 7.00000 days | Status: AC
Start: 2021-02-06 — End: 2021-02-07

## 2021-02-06 MED ORDER — ATORVASTATIN CALCIUM 80 MG PO TABS
80 mg | ORAL_TABLET | Freq: Every day | ORAL | 0 refills | 7 days | Status: AC
Start: 2021-02-06 — End: 2021-02-07

## 2021-02-06 MED ORDER — BUSPIRONE HCL 5 MG PO TABS
5 mg | ORAL_TABLET | Freq: Three times a day (TID) | ORAL | 0 refills | 1.00000 days | Status: AC | PRN
Start: 2021-02-06 — End: ?
  Filled 2021-02-07: qty 1, 1d supply, fill #0

## 2021-02-06 MED ORDER — LOSARTAN POTASSIUM 100 MG PO TABS
100 mg | ORAL_TABLET | Freq: Every day | ORAL | 0 refills | 30.00 days | Status: AC
Start: 2021-02-06 — End: 2021-02-07

## 2021-02-06 MED ORDER — ASPIRIN 81 MG PO TBEC
81 mg | ORAL_TABLET | Freq: Every day | ORAL | 0 refills | 7.00 days | Status: AC
Start: 2021-02-06 — End: 2021-02-07

## 2021-02-06 MED ORDER — BUSPIRONE HCL 5 MG PO TABS
5 mg | ORAL_TABLET | Freq: Three times a day (TID) | ORAL | 0 refills | 7 days | Status: AC | PRN
Start: 2021-02-06 — End: 2021-02-07

## 2021-02-06 MED ORDER — LOSARTAN POTASSIUM 100 MG PO TABS
100 mg | ORAL_TABLET | Freq: Every day | ORAL | 0 refills | 1.00 days | Status: AC
Start: 2021-02-06 — End: ?

## 2021-02-06 MED ORDER — ISOSORBIDE MONONITRATE ER 120 MG PO TB24
120 mg | ORAL_TABLET | Freq: Every day | ORAL | 0 refills | 1.00 days | Status: AC
Start: 2021-02-06 — End: ?

## 2021-02-06 MED ORDER — ISOSORBIDE MONONITRATE ER 120 MG PO TB24
120 mg | ORAL_TABLET | Freq: Every day | ORAL | 0 refills | 30 days | Status: AC
Start: 2021-02-06 — End: 2021-02-07

## 2021-02-06 MED ADMIN — RANOLAZINE ER 500 MG PO TB12: 1000 mg | ORAL | @ 03:00:00 | Stop: 2021-02-07 | NDC 60687054921

## 2021-02-06 MED ADMIN — VERAPAMIL HCL 2.5 MG/ML IV SOLN: @ 15:00:00 | Stop: 2021-02-06 | NDC 00409114405

## 2021-02-06 MED ADMIN — BUSPIRONE HCL 5 MG PO TABS: 5 mg | ORAL | @ 19:00:00 | Stop: 2021-02-07 | NDC 51079098501

## 2021-02-06 MED ADMIN — SODIUM CHLORIDE 0.9 % IV SOLN: @ 14:00:00 | Stop: 2021-02-06 | NDC 00338004902

## 2021-02-06 MED ADMIN — ASPIRIN 81 MG PO CHEW: 81 mg | ORAL | @ 17:00:00 | Stop: 2021-02-07 | NDC 63739043402

## 2021-02-06 MED ADMIN — HEPARIN SODIUM 1000 UNIT/ML SOLN 10 ML VIAL (BULK CHARGE): @ 15:00:00 | Stop: 2021-02-06 | NDC 00409272002

## 2021-02-06 MED ADMIN — IODIXANOL 320 MG/ML IV SOLN: 15 mL | INTRAVENOUS | @ 16:00:00 | Stop: 2021-02-07

## 2021-02-06 MED ADMIN — MIDAZOLAM HCL (PF) 2 MG/2ML IJ SOLN: @ 15:00:00 | Stop: 2021-02-06 | NDC 00409230517

## 2021-02-06 MED ADMIN — PERFLUTREN LIPID MICROSPHERE 6.52 MG/ML IV SUSP: Stop: 2021-02-06 | NDC 11994001104

## 2021-02-06 MED ADMIN — ACETAMINOPHEN 325 MG PO TABS: 650 mg | ORAL | @ 08:00:00 | Stop: 2021-02-07 | NDC 00904677361

## 2021-02-06 MED ADMIN — FENTANYL 50 MCG/ML INJ 2 ML (BULK CHARGE): @ 15:00:00 | Stop: 2021-02-06 | NDC 00409909422

## 2021-02-06 MED ADMIN — PERFLUTREN LIPID MICROSPHERE 6.52 MG/ML IV SUSP: 1.5 mL | INTRAVENOUS | Stop: 2021-02-06

## 2021-02-06 MED ADMIN — BUSPIRONE HCL 5 MG PO TABS: 5 mg | ORAL | @ 12:00:00 | Stop: 2021-02-07 | NDC 51079098501

## 2021-02-06 MED ADMIN — BUSPIRONE HCL 5 MG PO TABS: 5 mg | ORAL | @ 03:00:00 | Stop: 2021-02-07 | NDC 51079098501

## 2021-02-06 MED ADMIN — NITROGLYCERIN 100 MCG/ML 10 ML SYRINGE: @ 15:00:00 | Stop: 2021-02-06 | NDC 08252025010

## 2021-02-06 MED ADMIN — HEPARIN 25,000 UNITS/250 ML 0.45 NACL RTU - ANTI-XA LOW: 02800 [IU]/h | INTRAVENOUS | @ 01:00:00 | Stop: 2021-02-06 | NDC 00409765052

## 2021-02-06 MED ADMIN — ACETAMINOPHEN 325 MG PO TABS: 650 mg | ORAL | @ 12:00:00 | Stop: 2021-02-07 | NDC 00904677361

## 2021-02-06 MED ADMIN — MAGNESIUM SULFATE 4 GM/100ML IV SOLN: 4 g | INTRAVENOUS | @ 14:00:00 | Stop: 2021-02-06 | NDC 00409672923

## 2021-02-06 MED ADMIN — HEPARIN 25,000 UNITS/250 ML 0.45 NACL RTU - ANTI-XA LOW: 02800 [IU]/h | INTRAVENOUS | @ 01:00:00 | Stop: 2021-02-06

## 2021-02-06 MED ADMIN — ATORVASTATIN CALCIUM 80 MG PO TABS: 80 mg | ORAL | @ 03:00:00 | Stop: 2021-02-07 | NDC 00904629304

## 2021-02-06 MED ADMIN — RANOLAZINE ER 500 MG PO TB12: 1000 mg | ORAL | @ 17:00:00 | Stop: 2021-02-07 | NDC 60687054921

## 2021-02-06 NOTE — Progress Notes
CASE MANAGER ASSESSMENT      Admit ZOXW:[id number]    Date of Initial CM Assessment: 02/06/2021    Problems: Active Problems:    Chest pain POA: Yes    Pre-syncope POA: Yes       No past medical history on file. Past Surgical History:   Procedure Laterality Date    CONIZATION CERVIX LOOP      heart stent            Primary Care Physician:Cpn, Unassigned No Record Of Pcp -, MD  Phone:None      NEEDS ASSESSMENT:     Level of Function Prior to Admit: (P) Self Care/Indep. W ADLs    Primary Living Situation: (P) Lives w/Family         Pre-admission Living Situation: Home/Apartment                  Primary Support Systems: (P) Family members    Contact Name: (P) Wolf, Monique Phone Number: (P) [phone]   Does the patient have a Family/Support System member participating in Discharge Planning?: (P) Yes    DPOA?: (P) No       Bathroom on Main Floor: (P) Yes  Stairs in Home: (P) 0       Prior Treatments / Services: (P) None       Who is your PCP?: (P) MD located in [place]    Do you have your Primary Care Doctor's office number?: (P) Yes    How often do you visit your doctor?: (P) Annual    Do you need information/education regarding your medical condition?: (P) No       Verbalized financial concerns?: (P) No       Were you hospitalized in the last 30 days?: (P) No      DISCHARGE ASSESSMENT:     Projected Date of Discharge: 02/06/2021    Anticipated Complex D/C?: (P) No    Projected Discharge to: (P) Home    Discharge Address: (P) [address]  [place] [address]    Projected Discharge Needs: (P) None      Support Identified at Discharge: (P) Other (Comment) (Nephew)  Name of Discharge Support Person: (P) Monique Wolf Phone Number: (P) [phone]     Who is available to transport you upon discharge?: (P) Family Transportation         Monique Wolf Monique Pulling  DNP (c) MSNed RN [facility]   MEDSURG-BC CMGT-BC    Department of Care Coordination and Clinical Social Work   Medicine Case Manager  Primary Team: CCU/COU  T: [phone]  C: [phone]  Pager: [phone]  [place]ina: [phone]  Email  [email or web address]      --------------------------------------------------------------------------------------------------------------------------  For Weekend/Swing shift Case Management assistance contact [phone])    For assistance after hours:    Swing Case Manager: [name] RN / Work cell: [phone])  Swing Case Manager: [name] RN / Work cell: [phone])  ER Case Manager: [phone]) 24/7

## 2021-02-06 NOTE — Other
Patient's Clinical Goal:   Clinical Goal(s) for the Shift: VSS, pain management, I&O, safety, comfort  Identify possible barriers to advancing the care plan: none  Stability of the patient: Moderately Stable - low risk of patient condition declining or worsening   Progression of Patient's Clinical Goal:         Pt is AOx4, afebrile, denies NV/pain. HR 80s, NSR no ectopy, SBP 120s-130s, RA sating >93%. Voiding, LBM 02/05/21. Skin intact besides right wrist cath access. Received pt NPO, on heparin gtt @950  units/hr- held on call to cath lab. Returned at approx 0845- orders for heparin gtt d/c, and diet reordered. Followed ordered protocols for removal of TR band on right wrist as well as checking for pulses and sequence of vitals. Pt educated on S&S post cath lab and right wrist clean/dry- band aid placed on wrist for very mild blood drainage. Pt resting in bed, pt able to have hotel for the night then fly back to home city in [place] tomorrow AM. Purposeful rounding done to promote safety and comfort.    Printed and reviewed AVS with patient and family. Discussed all d/c meds with patient and family including purpose of medications, possible side effects, and the next time each medication is due to be taken. The next time each medication was due was printed on the AVS based on the last time they were given here in the hospital. Reviewed incision care, signs/symptoms that need to be reported to MD, follow up appointments, and dietary recommendations. Patient and family verbalized understanding. All questions addressed. Patient stated that she had no further questions. All belongings sent with patient. Transported via wheelchair and accompanied by d/c lounge staff until family arrives around 8pm per pt.

## 2021-02-06 NOTE — Progress Notes
Pharmaceutical Services - Meds to [facility] Discharge Medication Reconciliation and Counseling Note    Patient Name: Monique Wolf  Medical Record Number: [id number]  Admit date: 02/04/2021 2:44 PM    Age: 65 y.o.  Sex: female  Allergies: Atorvastatin and Hydrochlorothiazide w-triamterene    Preferred Pharmacy:   [facility] [phone]  [address]  [address]  [place] [address]         I reconciled the discharge medications and counseled the patient on all new prescriptions. Discharge prescriptions were delivered to bedside. The purpose, potential side effects, storage, missed doses and any special instructions related to each medication was discussed. Medications continued from home were also reviewed with the patient.    Discharge Medication List from AVS:     Changes To My Medications        START taking these medications        Dose Instructions   atorvastatin 80 mg tablet  Commonly known as: Lipitor   80 mg   Take 1 tablet (80 mg total) by mouth daily for 1 day.            CHANGE how you take these medications        Dose Instructions   busPIRone 5 mg tablet  Commonly known as: Buspar  What changed: reasons to take this   5 mg   Take 1 tablet (5 mg total) by mouth three (3) times daily as needed.            CONTINUE taking these medications        Dose Instructions   ASPIRIN LOW DOSE 81 mg EC tablet  Generic drug: aspirin   81 mg   Take 1 tablet (81 mg total) by mouth daily for 1 day.     isosorbide mononitrate 120 mg 24 hr tablet  Commonly known as: Imdur   120 mg   Take 1 tablet (120 mg total) by mouth daily for 1 day.     losartan 100 mg tablet  Commonly known as: Cozaar   100 mg   Take 1 tablet (100 mg total) by mouth daily for 1 day.     PRALUENT 150 MG/ML injection pen  Generic drug: Alirocumab   150 mg   Inject 150 mg under the skin every fourteen (14) days.     ranolazine 1000 mg 12 hr tablet  Commonly known as: Ranexa   1,000 mg   Take 1 tablet (1,000 mg total) by mouth two (2) times daily for 1 day.            STOP taking these medications        STOP taking these medications   albuterol 90 mcg/act inhaler  Commonly known as: Proventil HFA, Ventolin HFA      nitroglycerin 0.4 mg SL tablet  Commonly known as: Nitrostat      rosuvastatin 5 mg tablet  Commonly known as: Crestor                Prescriptions        These medications were sent to [facility] [phone])  [address], [place] [address]      Hours: Mon-Fri 8AM-9PM, Sat 8AM-7PM, Sun/Holidays 8AM-5PM (Closed 1PM-2PM for lunch) Phone: [phone]   ASPIRIN LOW DOSE 81 mg EC tablet  atorvastatin 80 mg tablet  busPIRone 5 mg tablet  isosorbide mononitrate 120 mg 24 hr tablet  losartan 100  mg tablet  ranolazine 1000 mg 12 hr tablet       Patient confirms having an adequate supply of all home medications including: Praluent    Patient encouraged to call the pharmacy back with any questions or concerns.       Hamilton Capri, PharmD, 02/06/2021, 3:53 PM

## 2021-02-06 NOTE — Op Note
Inpatient Cardiology Procedure Note    DATE OF PROCEDURE: 02/06/2021  TYPE OF PROCEDURE:    1. Selective left and right coronary artery angiography.   2. Interpretation of coronary artery angiography.   3. Left heart catheterization without ventriculogram   4. Moderate sedation, intial 15 minutes    ATTENDING PHYSICIAN: Bernie Covey, MD [phone]); interventional cardiology attending    INDICATION FOR PROCEDURE:   53F with hx of CAD s/p PCI to the Cx who presented with chest pain and syncope.     MEDICATIONS GIVEN:   1. 1% lidocaine, 1cc subcutaneously   2. heparin 2000 units IA   3. nitroglycerin IA   4. versed, 3mg  IV   5. fentanyl, IV    6. Verapamil, 2.5mg  IV    MODERATE SEDATION:   Under Dr. Mable Paris supervision, the above medications were administered to achieved moderate sedation. Pulse oximetry, end-tidal CO2, heart rate, and blood pressure were monitored by an independent, trained observer.  Dr. Delories Heinz spent a total of 20 minutes of face-to-face sedation time with the patient.       DESCRIPTION OF PROCEDURE:   After informed consent was obtained, the patient was brought to the Cardiac Catheterization Laboratory in a fasting, non-sedated state where a time out was performed prior to the procedure.  The patient was prepped and draped in a sterile fashion.  The right radial artery site was anesthetized using lidocaine. Using the modified Seldinger technique, the right radial artery was cannulated, and a 20F introducer sheath was placed.  A mix of heparin and nitroglycerin was injected intra-arterially.  The sheath was aspirated and flushed before each catheter insertion.  All catheter advancement and was performed over a guidewire under direct fluoroscopy guidance, with careful attention to aspiration, flushing, and pressure monitoring.   All catheter removal was performed over a guidewire.  Selective coronary angiography was performed in the standard fashion in multiple orthogonal views.   A 20F Tiger 4.0 catheter was used for both the left and right systems and to measure LV pressure. All catheters and wires were removed.  A TR band was placed per protocol to attain hemostasis,  and the sheath was removed.      TOTAL CONTRAST USED: 50 cc of Visipaque contrast.   TOTAL FLUOROSCOPIC TIME: 2 minutes.  ESTIMATED BLOOD LOSS: less than 20cc.   COMPLICATIONS: none.     HEMODYNAMICS:   1. Opening aortic pressure is 126/86 mmHg (mean ).    2. LVEDP is 18 mmHg.    3. AV: no significant pressure gradient across the aortic valve using pullback technique.       CORONARY ARTERY ANGIOGRAPHY:   1. Left main artery: The left main artery is angiographically normal.   2.Left anterior descending artery: The LAD has mild irregularities.  D1 is a <1.57mm vessel with an ostial 90% stenosis but is a short vessel as well.  D2 is a moderate sized vessel with mild irregularities.     3. Left circumflex artery: The circumflex artery has a proximal stent which is patent without restesnosis.  There is a moderate sized OM1 and a small OM2 with mild irregularities.     4. Right coronary artery: The right coronary artery is a dominant artery.  The PL originates very early.  The mid RCA has a 40% stenosis before the PL origin.  Remainder of the territory has mild irregularities.     CONCLUSION/SUMMARY:   1. Patent Cx stent, non-obstructive CAD other than a small  diagonal branch.     RECOMMENDATIONS:   1. Continue medical therapy for CAD.    Bernie Covey, MD  Division of Cardiology  Pager [phone]

## 2021-02-06 NOTE — Other
Patient's Clinical Goal:   Clinical Goal(s) for the Shift: Maintain stable VS; Observe chest pain occurence and treat; Adequate oxygenation, prevent fall; Promote sleep hygiene. To keep NPO midnight for heart cath 9/23  Identify possible barriers to advancing the care plan: None  Stability of the patient: Moderately Unstable - medium risk of patient condition declining or worsening      Progression of Patient's Clinical Goal: No complain of chest pain overnight, NPO since midnight for pending LHC, scheduled at 0730 today. No new acute event overnight. AAOx4. VS stable. NSR 70-80, no ectopy. Afebrile. Headache managing by PRN Tylenol given Q6. Denied SOB. On room air throughout, no desaturation. Voids adequately. Good appetite, regular BM. No n/v, no abdominal pain. Denied general pain nor discomfort. Ambulates independently. Promoted sleep hygiene. Needs attended. Comfort and safety maintained.

## 2021-02-06 NOTE — Discharge Instructions
Dear Monique Wolf,    You were in the hospital for chest pain with a history of prior heart disease. You were [place] from the airport for a heart attack, which did not appear to be the case with intact heart function on imaging and no evidence of heart injury in your blood labs.     During this hospitalization, we treated you with medications to decrease your risk of blockages of your heart vessels. We evaluated your heart vessels with coronary angiography without need for additional stenting.     Please follow up with your primary care provider and your cardiologist back in [place].       Please take the following medications:     Medication List        START taking these medications      atorvastatin 80 mg tablet  Commonly known as: Lipitor  Take 1 tablet (80 mg total) by mouth daily for 1 day.            CHANGE how you take these medications      busPIRone 5 mg tablet  Commonly known as: Buspar  Take 1 tablet (5 mg total) by mouth three (3) times daily as needed.  What changed: reasons to take this            CONTINUE taking these medications      aspirin 81 mg EC tablet  Take 1 tablet (81 mg total) by mouth daily for 1 day.     isosorbide mononitrate 120 mg 24 hr tablet  Commonly known as: Imdur  Take 1 tablet (120 mg total) by mouth daily for 1 day.     losartan 100 mg tablet  Commonly known as: Cozaar  Take 1 tablet (100 mg total) by mouth daily for 1 day.     PRALUENT 150 MG/ML injection pen  Generic drug: Alirocumab     ranolazine 1000 mg 12 hr tablet  Commonly known as: Ranexa  Take 1 tablet (1,000 mg total) by mouth two (2) times daily for 1 day.            STOP taking these medications      albuterol 90 mcg/act inhaler  Commonly known as: Proventil HFA, Ventolin HFA     nitroglycerin 0.4 mg SL tablet  Commonly known as: Nitrostat     rosuvastatin 5 mg tablet  Commonly known as: Crestor               Where to Get Your Medications        These medications were sent to [facility] [phone])  [address], [place] [address]      Hours: Mon-Fri 8AM-9PM, Sat 8AM-7PM, Sun/Holidays 8AM-5PM (Closed 1PM-2PM for lunch) Phone: [phone]   aspirin 81 mg EC tablet  atorvastatin 80 mg tablet  busPIRone 5 mg tablet  isosorbide mononitrate 120 mg 24 hr tablet  losartan 100 mg tablet  ranolazine 1000 mg 12 hr tablet         Please ensure that you follow up on your hospitalization with your primary care doctor to discuss your hospitalization and changes to your medication.  It is important that you follow up with your doctor(s) in the coming weeks. We have schedulers who will help you make these appointments.  - If you do not hear from them or need additional assistance, please call our referral service at [phone]) or go to [email or web address]  Please return to the hospital and speak to a physician if you develop uncontrollable fevers, chills, nausea, vomiting, worsening chest pain or shortness of breath, or any symptoms concerning to you.   If you feel like your illness/symptoms are worsening and need urgent evaluation (i.e. you cannot wait until your follow up visits or your primary care physician cannot see you):   - You can get same day medical attention at an urgent care (find a  urgent care office at [email or web address])  - If you need more serious immediate care, please return to the ER.    Your future appointments are listed below:  No future appointments.    It was a pleasure taking care of you,    Dr. Salley Scarlet  Internal Medicine  [facility]

## 2021-02-07 NOTE — Discharge Summary
Discharge Summary   Name: Monique Wolf MRN: [id number] DOB: 1956-04-23   Admit Date: 02/04/2021 D/C Date: 02/06/2021      LOS:   2 days    Admit Attending: Durward Fortes, MD Discharge Attending: Durward Fortes., MD    PCP: Glenetta Borg No Record Of Pcp -, MD Discharge Provider: Gaynelle Cage, MD     Inpatient Care Team/ Consults:     CCU  Interventional Cardiology Outpatient Care Team  No care team member to display     Admission Diagnosis:  #CAD s/p?LCx?stent (2017)?  #Atypical chest pain, POA  #Back pain, POA Discharge Diagnosis:  #CAD s/p?LCx?stent (2017)?  #Atypical chest pain, POA  #Back pain, POA  #Pre-syncope, POA  #C/f sinus pause/SSS/bradycardia, POA  #Pericardial effusion, POA  #Breast cancer, POA  #HTN, POA  #Anxiety, POA     Disposition:  Home or Self care    Discharge Condition:  stable       HPI:   Per H&P on 02/04/21:   ''Monique Wolf is a 65 y.o. female PMH of CAD s/p stent placement x1 (2019) and HTN p/w sudden onset chest discomfort and back pain. She was sitting in a chair at [place] waiting for her return flight to her home in [place], [place] when she expressed dizziness a/w black spots in her vision and feeling like she was about to pass out. He then felt an 8/10, nonradiating pain located underneath her left shoulder blade. She took SLNG x2 3-5 min apart which reduced her pain to a 6/10. She was evaluated by paramedics, given ASA 325mg  x1, 250cc NS. EKG read acute STEMI and she was BIBA to [facility]. Endorses diaphoresis, denies n/v, SOB, f/c.  ?  Of note, she states that she had a Holter monitor in early 2021 which showed her ''heart stopped 16 times''. She had a loop recorder implanted in 01/2020 with plan for possible pacemaker. Her OP cardiologist is Dr. Gypsy Balsam in [place] [phone]). She reports being on losartan, imdur, ASA 81mg , and 1g ranexa BID. No allergies, [facility]:    Patient was admitted to the cardiology service in the setting of experiencing atypical chest pain in the setting of a prior left circ stent and known coronary artery disease with associated presyncope.  She presented from the [facility] airport and was brought in by ambulance to [facility].  She was called out as a STEMI, but on arrival she did not demonstrate any concerning EKG findings.  She was evaluated with a left heart catheterization on September 23rd that demonstrated a patent stents without any significant coronary artery disease in need of intervention; she demonstrated an ostial 90% stenosis in her D1, but this was a small vessel not amenable to intervention.  This may be the culprit lesion causing her chest discomfort in the setting of a likely hypotensive episode after taking sublingual nitroglycerin at the airport.  Her presyncopal symptoms may also be attributable to this, but she also has a known history of anxiety and her symptoms were not congruent with a cardiogenic, neurogenic, or vasovagal symptomatology; less concern for pulmonary embolism as well.  Additionally, her loop recorder was interrogated, which demonstrated no concerning arrhythmias or significant events that would explain her symptoms at the airport. She was restarted on her home medications with plan to fly back to [place] the morning after discharge.  after discussion of her current medical status, she was deemed stable for discharge with  close follow-up with her cardiologist in [place].  She was sent home with a CD ROM of her left heart catheterization, echo report, and electrocardiograms for evaluation by her personal cardiologist.    #CAD s/p?LCx?stent (2017)?  #Atypical chest pain, POA  #Back pain, POA  TIMI score of 3. Grace score of 83.?Non-radiating, 8/10 pressure sensation under mid-back/shoulder blade after hopping off plane -> SLNG x2 -> 6/10, but hypotensive in field to SBP 60s. Given ASA 325mg , 250cc NS. ECG without sig STE. CXR without widened mediastinum, less concern for AD. No PTX.?No PE on CTA, Unstable angina vs in stent restenosis.?Considered pericarditis given improved with leaning forward; No viral prodrome. Possible transient hypotensive state causing decreased coronary perfusion.?0/10 pain on re-examination. ASA load, no plavix given CP free.  - Contacted OP cardiologist               - cell [phone]               - [employer] Reveal Linq loop recorder               - Multiple presyncope events in the past, no captures on loop recorder. Last interrogation was April 2022.               - Stent of LCx in 2017  - Lipitor 80 daily  - ASA 81 daily, s/p ASA load  - Formal TTE as above  - Loop recorder interrogation performed on 9/22 without events  ?  #Pre-syncope, POA  Feeling faint, dizzy?a/w?diaphoresis without LOC. Loop recorder from home Cardiologist,?Positive?cardiac hx. On tele. CTA neg for AD, but notable for LV posterior basal wall thickening.?Likely 2/2 cardiac etiology.  - Loop recorder interrogation performed  - See plan above  ?  #C/f sinus pause/SSS/bradycardia, POA  H/o ''heart stopping 16 times'' on Holter monitor in early 2021. PPM considered in past.  - Loop recorder interrogation performed, no concern findings  - See plan above  ?  #Pericardial effusion, POA  Trace on bedside echo.  ?  #Breast cancer, POA  No active therapy?  ?  #HTN, POA  - home losartan  ?  #Anxiety, POA  - home Buspar 5mg  TID    Procedures & Operations Performed:   CORONARY ARTERY ANGIOGRAPHY:              1. Left main artery: The left main artery is angiographically normal.              2.Left anterior descending artery: The LAD has mild irregularities.  D1 is a <1.16mm vessel with an ostial 90% stenosis but is a short vessel as well.  D2 is a moderate sized vessel with mild irregularities.                3. Left circumflex artery: The circumflex artery has a proximal stent which is patent without restesnosis.  There is a moderate sized OM1 and a small OM2 with mild irregularities. 4. Right coronary artery: The right coronary artery is a dominant artery.  The PL originates very early.  The mid RCA has a 40% stenosis before the PL origin.  Remainder of the territory has mild irregularities.   ?  CONCLUSION/SUMMARY:              1. Patent Cx stent, non-obstructive CAD other than a small diagonal branch.   ?  RECOMMENDATIONS:              1. Continue medical therapy for  CAD.    Relevant Imaging Studies (most recent results only):   Last Echo Adult: Echo adult transthoracic w definity    Result Date: 02/05/2021   1. Technically difficult study despite echo contrast.  2. Normal LV size, normal wall thickness, low normal systolic function, normal LV diastolic function.  3. Left ventricular ejection fraction is approximately 50-55%.  4. Normal right ventricular size and normal systolic function.  5. There are no prior echocardiograms available for comparison. [id number] Arvin Collard MD Electronically signed by [id number] Arvin Collard MD on 02/05/2021 at 6:12:07 PM  cc: Warren Lacy   Final       Relevant labs:       CBC:  Lab Results   Component Value Date    WBC 5.84 02/06/2021    HGB 15.8 (H) 02/06/2021    HCT 45.3 (H) 02/06/2021    PLT 249 02/06/2021     LFT:  Lab Results   Component Value Date    TOTPRO 6.5 02/04/2021    ALBUMIN 4.3 02/04/2021    BILITOT 0.5 02/04/2021    ALKPHOS 64 02/04/2021    AST 19 02/04/2021    ALT 20 02/04/2021       BMP:  Lab Results   Component Value Date    NA 137 02/06/2021    K 4.3 02/06/2021    CL 100 02/06/2021    CO2 23 02/06/2021    BUN 20 02/06/2021    CREAT 0.77 02/06/2021    CALCIUM 9.8 02/06/2021    GLUCOSE 96 02/06/2021       Cal/iCal/MG/Ph:  Lab Results   Component Value Date    CALCIUM 9.8 02/06/2021    MG 1.7 02/06/2021     Coags:  Lab Results   Component Value Date    PT 13.3 02/04/2021    APTT <24.0 (L) 02/04/2021    INR 1.0 02/04/2021               Physical Exam:   BP 131/72  ~ Pulse 80  ~ Temp 36.6 ?C (97.9 ?F) (Oral)  ~ Resp 16  ~ Ht 1.524 m (5')  ~ Wt 69.3 kg (152 lb 11.2 oz)  ~ SpO2 96%  ~ BMI 29.82 kg/m?   Gen: Well-developed in no acute distress.  Eyes: PERRL, sclera anicteric, no conjunctival pallor.  ENT: Moist mucous membranes, oropharynx clear without exudate.  Neck: Supple, trachea midline. No cervical or supraclavicular lymphadenopathy.  CV: Normal rate, regular rhythm. Normal S1 and S2. No murmurs, rubs, gallops.  Pulm: Normal work of breathing on room air. Fair inspiratory effort, fair air movement. Lungs CTAB with no wheezes, crackles, or rhonchi.  Abd: Non-distended. Soft, non-tender to palpation. No appreciable hepatosplenomegaly. Normoactive bowel sounds.  Skin: Warm and dry, no rashes.  Ext: No cyanosis or edema. Distal pulses 2+ and symmetric.  Neuro: AAOx3, CNII-XII grossly intact. Moving all extremities. Sensation grossly intact      Outpatient Provider To-Do List  -follow up with cardiologist in [facility]  -patient is discharged with CD ROM of her left heart catheterization, TTE report, and electrocardiograms  -continued medical management for coronary artery disease  -aggressive risk factor alleviation, aerobic exercise, and diet modifications      LACE+ Score: 48 (02/06/21 1515) Moderate Risk of Readmission        Pending Labs   The following tests have been collected, but not yet resulted at the time of discharge.    Unresulted Labs (From admission, onward)  None        The following labs have a preliminary result at the time of discharge.  Please check back if the final results have changed.  Preliminary Resulted Labs (From admission, onward)            None        Discharge Medication List  Coumadin and Opiate Risk Assessment   This patient does not have coumadin on their discharge med list  No controlled substances on discharge med list   Discharge Medication List as of 02/06/2021  2:11 PM      CONTINUE these medications which have CHANGED    Details   aspirin 81 mg EC tablet Take 1 tablet (81 mg total) by mouth daily for 1 day., Starting Fri 02/06/2021, Until Sat 02/07/2021, Normal      atorvastatin 80 mg tablet Take 1 tablet (80 mg total) by mouth daily for 1 day., Starting Fri 02/06/2021, Until Sat 02/07/2021, Normal      busPIRone 5 mg tablet Take 1 tablet (5 mg total) by mouth three (3) times daily as needed., Starting Fri 02/06/2021, Until Sat 02/07/2021 at 2359, Normal      isosorbide mononitrate 120 mg 24 hr tablet Take 1 tablet (120 mg total) by mouth daily for 1 day., Starting Fri 02/06/2021, Until Sat 02/07/2021, Normal      losartan 100 mg tablet Take 1 tablet (100 mg total) by mouth daily for 1 day., Starting Fri 02/06/2021, Until Sat 02/07/2021, Normal      ranolazine 1000 mg 12 hr tablet Take 1 tablet (1,000 mg total) by mouth two (2) times daily for 1 day., Starting Fri 02/06/2021, Until Sat 02/07/2021, Normal         CONTINUE these medications which have NOT CHANGED    Details   Alirocumab (PRALUENT) 150 MG/ML injection pen Inject 150 mg under the skin every fourteen (14) days., Starting Tue 12/09/2020, Historical Med         STOP taking these medications       nitroglycerin 0.4 mg SL tablet        rosuvastatin 5 mg tablet        albuterol 90 mcg/act inhaler             Requested Appointments  There are no active discharge follow-up orders for this encounter.   Scheduled Appointments  No future appointments.       Nutrition Recommendations & Malnutrition Assessment   I have seen and examined the patient and agree with the RD assessment detailed below:  Patient meets criteria for:      (current weight 69.3 kg (152 lb 11.2 oz), BMI (Calculated): 29.82;  ,  ). See RD notes for additional details.         The patient was not consulted or assessed by a Registered Dietitian (RD) during this admission.  Discharge Diet Orders:  Low fat: fresh fruits and vegetables, whole grains, low or non-fat dairy   products and lean meats.   As directed        Skin/Wound         Discharge Activity Orders    Okay to be physically active   As directed        Rehab Assessment   No PT evaluation this encounter          No OT evaluation this encounter          Case Manager/Home Health Assessment   Discharge Information  Discharge Address:  [address]  [place] [address] (02/06/21 9811)              Home Health Orders  There are no active discharge home health orders for this encounter.   Goals of Care Note (if new for this encounter)     I have seen and examined the patient on their discharge day. I have reviewed, edited, and agree with the above discharge summary. More than 30 minutes was personally spent on discharge planning on the day of discharge in coordination of care, counseling the patient and preparation of discharge.     DWA, Dr. Lucretia Field, MD  02/07/2021 3:42 PM

## 2021-02-07 NOTE — Nursing Note
[facility] Notes:    1514  Arrived in [facility] from 7w accompanied by ESCORT via wheelchair.  Awake, Ox4, waiting for ride home.  Dinner ordered and served.  1900 Patient escorted to main lobby to wait for family.

## 2021-02-16 ENCOUNTER — Ambulatory Visit (INDEPENDENT_AMBULATORY_CARE_PROVIDER_SITE_OTHER): Payer: No Typology Code available for payment source

## 2021-02-16 DIAGNOSIS — R55 Syncope and collapse: Secondary | ICD-10-CM

## 2021-02-16 LAB — CUP PACEART REMOTE DEVICE CHECK
Date Time Interrogation Session: 20220927115803
Implantable Pulse Generator Implant Date: 20210921

## 2021-02-24 NOTE — Progress Notes (Signed)
[facility] Summary Report / Loop Recorder 

## 2021-03-13 ENCOUNTER — Telehealth: Payer: Self-pay | Admitting: Cardiology

## 2021-03-13 MED ORDER — ISOSORBIDE MONONITRATE ER 120 MG PO TB24: 120.0000 mg | ORAL_TABLET | Freq: Every day | ORAL | 3 refills | Status: DC

## 2021-03-13 MED ORDER — RANOLAZINE ER 1000 MG PO TB12: 1000.0000 mg | ORAL_TABLET | Freq: Two times a day (BID) | ORAL | 3 refills | Status: DC

## 2021-03-13 MED ORDER — LOSARTAN POTASSIUM 100 MG PO TABS: 100.0000 mg | ORAL_TABLET | Freq: Every day | ORAL | 3 refills | Status: DC

## 2021-03-13 NOTE — Telephone Encounter (Signed)
Refills sent in per request 

## 2021-03-13 NOTE — Telephone Encounter (Signed)
*  STAT* If patient is at the pharmacy, call can be transferred to refill team.   1. Which medications need to be refilled? (please list name of each medication and dose if known) *new prescription for all of her medicine Ranexa, Isosorbide and Losartan  2. Which pharmacy/location (including street and city if local pharmacy) is medication to be sent to? [facility] in [place]  3. Do they need a 30 day or 90 day supply? 90 days and refills

## 2021-03-17 LAB — CUP PACEART REMOTE DEVICE CHECK
Date Time Interrogation Session: 20221030120131
Implantable Pulse Generator Implant Date: 20210921

## 2021-03-23 ENCOUNTER — Ambulatory Visit (INDEPENDENT_AMBULATORY_CARE_PROVIDER_SITE_OTHER): Payer: No Typology Code available for payment source

## 2021-03-23 DIAGNOSIS — R55 Syncope and collapse: Secondary | ICD-10-CM | POA: Diagnosis not present

## 2021-03-31 ENCOUNTER — Ambulatory Visit (INDEPENDENT_AMBULATORY_CARE_PROVIDER_SITE_OTHER): Payer: No Typology Code available for payment source | Admitting: Cardiology

## 2021-03-31 ENCOUNTER — Encounter: Payer: Self-pay | Admitting: Cardiology

## 2021-03-31 ENCOUNTER — Other Ambulatory Visit: Payer: Self-pay

## 2021-03-31 VITALS — BP 130/80 | HR 89 | Ht 60.0 in | Wt 152.2 lb

## 2021-03-31 DIAGNOSIS — Z531 Procedure and treatment not carried out because of patient's decision for reasons of belief and group pressure: Secondary | ICD-10-CM | POA: Insufficient documentation

## 2021-03-31 DIAGNOSIS — M25579 Pain in unspecified ankle and joints of unspecified foot: Secondary | ICD-10-CM | POA: Insufficient documentation

## 2021-03-31 DIAGNOSIS — G4733 Obstructive sleep apnea (adult) (pediatric): Secondary | ICD-10-CM | POA: Insufficient documentation

## 2021-03-31 DIAGNOSIS — R450 Nervousness: Secondary | ICD-10-CM | POA: Insufficient documentation

## 2021-03-31 DIAGNOSIS — E041 Nontoxic single thyroid nodule: Secondary | ICD-10-CM | POA: Insufficient documentation

## 2021-03-31 DIAGNOSIS — N859 Noninflammatory disorder of uterus, unspecified: Secondary | ICD-10-CM | POA: Insufficient documentation

## 2021-03-31 DIAGNOSIS — R55 Syncope and collapse: Secondary | ICD-10-CM | POA: Diagnosis not present

## 2021-03-31 DIAGNOSIS — E785 Hyperlipidemia, unspecified: Secondary | ICD-10-CM

## 2021-03-31 DIAGNOSIS — R6889 Other general symptoms and signs: Secondary | ICD-10-CM | POA: Insufficient documentation

## 2021-03-31 DIAGNOSIS — H532 Diplopia: Secondary | ICD-10-CM | POA: Insufficient documentation

## 2021-03-31 DIAGNOSIS — C50919 Malignant neoplasm of unspecified site of unspecified female breast: Secondary | ICD-10-CM | POA: Insufficient documentation

## 2021-03-31 DIAGNOSIS — I2510 Atherosclerotic heart disease of native coronary artery without angina pectoris: Secondary | ICD-10-CM

## 2021-03-31 DIAGNOSIS — G43909 Migraine, unspecified, not intractable, without status migrainosus: Secondary | ICD-10-CM | POA: Insufficient documentation

## 2021-03-31 DIAGNOSIS — Z8249 Family history of ischemic heart disease and other diseases of the circulatory system: Secondary | ICD-10-CM | POA: Insufficient documentation

## 2021-03-31 DIAGNOSIS — Z7189 Other specified counseling: Secondary | ICD-10-CM | POA: Insufficient documentation

## 2021-03-31 DIAGNOSIS — R519 Headache, unspecified: Secondary | ICD-10-CM | POA: Insufficient documentation

## 2021-03-31 DIAGNOSIS — IMO0001 Reserved for inherently not codable concepts without codable children: Secondary | ICD-10-CM | POA: Insufficient documentation

## 2021-03-31 DIAGNOSIS — Z853 Personal history of malignant neoplasm of breast: Secondary | ICD-10-CM | POA: Insufficient documentation

## 2021-03-31 DIAGNOSIS — J069 Acute upper respiratory infection, unspecified: Secondary | ICD-10-CM | POA: Insufficient documentation

## 2021-03-31 DIAGNOSIS — J309 Allergic rhinitis, unspecified: Secondary | ICD-10-CM | POA: Insufficient documentation

## 2021-03-31 DIAGNOSIS — H9190 Unspecified hearing loss, unspecified ear: Secondary | ICD-10-CM | POA: Insufficient documentation

## 2021-03-31 DIAGNOSIS — M779 Enthesopathy, unspecified: Secondary | ICD-10-CM | POA: Insufficient documentation

## 2021-03-31 DIAGNOSIS — N952 Postmenopausal atrophic vaginitis: Secondary | ICD-10-CM | POA: Insufficient documentation

## 2021-03-31 MED ORDER — CLOPIDOGREL BISULFATE 75 MG PO TABS: 75.0000 mg | ORAL_TABLET | Freq: Every day | ORAL | 1 refills | Status: DC

## 2021-03-31 NOTE — Progress Notes (Signed)
[facility] Summary Report / Loop Recorder 

## 2021-03-31 NOTE — Patient Instructions (Signed)
Medication Instructions:  Your physician has recommended you make the following change in your medication:  START :Plavix 75 mg daily  *If you need a refill on your cardiac medications before your next appointment, please call your pharmacy*   Lab Work: Your physician recommends that you return for lab work today: lipid/lft If you have labs (blood work) drawn today and your tests are completely normal, you will receive your results only by: [place] (if you have MyChart) OR A paper copy in the mail If you have any lab test that is abnormal or we need to change your treatment, we will call you to review the results.   Testing/Procedures: None   Follow-Up: At [facility], you and your health needs are our priority.  As part of our continuing mission to provide you with exceptional heart care, we have created designated Provider Care Teams.  These Care Teams include your primary Cardiologist (physician) and Advanced Practice Providers (APPs -  Physician Assistants and Nurse Practitioners) who all work together to provide you with the care you need, when you need it.  We recommend signing up for the patient portal called "MyChart".  Sign up information is provided on this After Visit Summary.  MyChart is used to connect with patients for Virtual Visits (Telemedicine).  Patients are able to view lab/test results, encounter notes, upcoming appointments, etc.  Non-urgent messages can be sent to your provider as well.   To learn more about what you can do with MyChart, go to [email or web address].    Your next appointment:   3 month(s)  The format for your next appointment:   In Person  Provider:   Jenne Campus, MD    Other Instructions  Clopidogrel Tablets What is this medication? CLOPIDOGREL (kloh PID oh grel) lowers the risk of heart attack, stroke, or blood clots. It prevents blood cells (platelets) from clumping together to form a clot. It belongs to a group of  medications called antiplatelets. This medicine may be used for other purposes; ask your health care provider or pharmacist if you have questions. COMMON BRAND NAME(S): Plavix What should I tell my care team before I take this medication? They need to know if you have any of the following conditions: Bleeding disorders Bleeding in the brain Having surgery History of stomach bleeding An unusual or allergic reaction to clopidogrel, other medications, foods, dyes, or preservatives Pregnant or trying to get pregnant Breast-feeding How should I use this medication? Take this medication by mouth with a glass of water. Follow the directions on the prescription label. You may take this medication with or without food. If it upsets your stomach, take it with food. Take your medication at regular intervals. Do not take it more often than directed. Do not stop taking except on your care team's advice. A special MedGuide will be given to you by the pharmacist with each prescription and refill. Be sure to read this information carefully each time. Talk to your care team about the use of this medication in children. Special care may be needed. Overdosage: If you think you have taken too much of this medicine contact a poison control center or emergency room at once. NOTE: This medicine is only for you. Do not share this medicine with others. What if I miss a dose? If you miss a dose, take it as soon as you can. If it is almost time for your next dose, take only that dose. Do not take double or extra doses. What  may interact with this medication? Do not take this medication with the following: Dasabuvir; ombitasvir; paritaprevir; ritonavir Defibrotide Selexipag This medication may also interact with the following: Certain medications that treat or prevent blood clots like warfarin Narcotic medications for pain NSAIDs, medications for pain and inflammation, like ibuprofen or naproxen Repaglinide SNRIs,  medications for depression, like desvenlafaxine, duloxetine, levomilnacipran, venlafaxine SSRIs, medications for depression, like citalopram, escitalopram, fluoxetine, fluvoxamine, paroxetine, sertraline Stomach acid blockers like cimetidine, esomeprazole, omeprazole This list may not describe all possible interactions. Give your health care provider a list of all the medicines, herbs, non-prescription drugs, or dietary supplements you use. Also tell them if you smoke, drink alcohol, or use illegal drugs. Some items may interact with your medicine. What should I watch for while using this medication? Visit your care team for regular check-ups. Do not stop taking your medication unless your care team tells you to. Notify your care team and seek emergency services if you develop sudden numbness or weakness of the face, arm, or leg, trouble speaking, confusion, trouble walking, loss of balance or coordination, dizziness, severe headache, or change in vision. These can be signs that your condition has gotten worse. If you are going to have surgery or dental work, tell your care team that you are taking this medication. Certain genetic factors may reduce the effect of this medication. Your care team may use genetic tests to determine treatment. Only take aspirin if you are instructed to. Low doses of aspirin are used with this medication to treat some conditions. Taking aspirin with this medication can increase your risk of bleeding, so you must be careful. Talk to your care team if you have questions. What side effects may I notice from receiving this medication? Side effects that you should report to your care team as soon as possible: Allergic reactions--skin rash, itching, hives, swelling of the face, lips, tongue, or throat Bleeding--bloody or black, tar-like stools, red or dark brown urine, vomiting blood or brown material that looks like coffee grounds, small, red or purple spots on the skin, unusual  bleeding or bruising TTP--purple spots on the skin or inside the mouth, pale skin, yellowing skin or eyes, unusual weakness or fatigue, fever, fast or irregular heartbeat, confusion, change in vision, trouble speaking, trouble walking Side effects that usually do not require medical attention (report to your care team if they continue or are bothersome): Diarrhea Headache This list may not describe all possible side effects. Call your doctor for medical advice about side effects. You may report side effects to FDA at 1-800-FDA-1088. Where should I keep my medication? Keep out of the reach of children and pets. Store at room temperature of 59 to 86 degrees F (15 to 30 degrees C). Throw away any unused medication after the expiration date. NOTE: This sheet is a summary. It may not cover all possible information. If you have questions about this medicine, talk to your doctor, pharmacist, or health care provider.  2022 Elsevier/Gold Standard (2020-05-06 00:00:00)

## 2021-03-31 NOTE — Progress Notes (Signed)
Cardiology Office Note:    Date:  03/31/2021   ID:  Penny Boyer, DOB [birth year], MRN [id number]  PCP:  [facility]  Cardiologist:  Jenne Campus, MD    Referring MD: [place]   Chief Complaint  Patient presents with   Follow-up  Hours in the hospital in [place]  History of Present Illness:    Penny Boyer is a 64 y.o. female with past medical history significant for coronary artery disease.  In 2017 she did have PTCA and stenting of the circumflex artery.  Also dyslipidemia, history of obesity but she lost significant mount of weight, obstructive sleep apnea, breast CA. Recently she ended up being in [place].  She still having chest pain at the airport.  She was taken to [facility], cardiac catheterization was urgently performed which showed narrowing up to 90% of small diagonal branch.  It was too small to intervene therefore medical therapy were continued.  She also had episodes of hypotension which were related most likely to nitroglycerin that she used. She comes today 2 months of follow-up overall she seems to be doing well.  She denies have any chest pain tightness squeezing pressure burning chest no more dizziness or passing out.  Past Medical History:  Diagnosis Date   Abnormal nuclear stress test 12/20/2018   Chest pain 02/02/2020   Coronary artery disease involving native coronary artery has post PTCA and stenting of the circumflex artery in March 2017 of native heart 07/28/2016   Overview:  PTCA and stent of LCX artery in March 2017 in [place]   Dyslipidemia (high LDL; low HDL) 07/28/2016   Hyperlipidemia    Hypertension    Kidney stone 10/17/2019   Precordial pain 07/28/2016   Syncope     Past Surgical History:  Procedure Laterality Date   ABDOMINAL HYSTERECTOMY     APPENDECTOMY     CARDIAC CATHETERIZATION     CHOLECYSTECTOMY     FOOT SURGERY     INTRAVASCULAR PRESSURE WIRE/FFR STUDY N/A 12/20/2018   Procedure: INTRAVASCULAR PRESSURE WIRE/FFR  STUDY;  Surgeon: Leonie Man, MD;  Location: [facility];  Service: Cardiovascular;  Laterality: N/A;   LEFT HEART CATH AND CORONARY ANGIOGRAPHY N/A 12/20/2018   Procedure: LEFT HEART CATH AND CORONARY ANGIOGRAPHY;  Surgeon: Leonie Man, MD;  Location: [facility];  Service: Cardiovascular;  Laterality: N/A;   MANDIBLE FRACTURE SURGERY     Ulnar Surgery     WRIST FRACTURE SURGERY      Current Medications: Current Meds  Medication Sig   Alirocumab (PRALUENT) 150 MG/ML SOAJ Inject 1 Dose into the skin every 14 (fourteen) days.   aspirin EC 81 MG tablet Take 81 mg by mouth daily. Swallow whole.   busPIRone (BUSPAR) 10 MG tablet Take 5 mg by mouth 3 (three) times daily as needed (amxiety). Take 1/2 tablet 3 times daily   isosorbide mononitrate (IMDUR) 120 MG 24 hr tablet Take 1 tablet (120 mg total) by mouth daily.   losartan (COZAAR) 100 MG tablet Take 1 tablet (100 mg total) by mouth daily.   nitroGLYCERIN (NITROSTAT) 0.4 MG SL tablet Place 1 tablet (0.4 mg total) under the tongue as needed for chest pain.   ranolazine (RANEXA) 1000 MG SR tablet Take 1 tablet (1,000 mg total) by mouth 2 (two) times daily.   rosuvastatin (CRESTOR) 5 MG tablet Take 1 tablet (5 mg total) by mouth daily.     Allergies:   Atorvastatin and  Hydrochlorothiazide w-triamterene   Social History   Socioeconomic History   Marital status: Married    Spouse name: Not on file   Number of children: Not on file   Years of education: Not on file   Highest education level: Not on file  Occupational History   Not on file  Tobacco Use   Smoking status: Former   Smokeless tobacco: Never  Vaping Use   Vaping Use: Never used  Substance and Sexual Activity   Alcohol use: Yes   Drug use: No   Sexual activity: Not on file  Other Topics Concern   Not on file  Social History Narrative   Not on file   Social Determinants of Health   Financial Resource Strain: Not on file  Food Insecurity: Not  on file  Transportation Needs: Not on file  Physical Activity: Not on file  Stress: Not on file  Social Connections: Not on file     Family History: The patient's family history includes Cancer in her mother; Heart disease in her brother, brother, and father. ROS:   Please see the history of present illness.    All 14 point review of systems negative except as described per history of present illness  EKGs/Labs/Other Studies Reviewed:    Cardiac catheterization done few weeks ago at [facility] showed: Procedures & Operations Performed:  CORONARY ARTERY ANGIOGRAPHY: 1. Left main artery: The left main artery is angiographically normal. 2.Left anterior descending artery: The LAD has mild irregularities. D1 is a <1.30mm vessel with an ostial 90% stenosis but is a short vessel as well. D2 is a moderate sized vessel with mild irregularities.  3. Left circumflex artery: The circumflex artery has a proximal stent which is patent without restesnosis. There is a moderate sized OM1 and a small OM2 with mild irregularities.  4. Right coronary artery: The right coronary artery is a dominant artery. The PL originates very early. The mid RCA has a 40% stenosis before the PL origin. Remainder of the territory has mild irregularities   Echocardiogram performed at [facility] on 02/05/2021 showed: 1. Technically difficult study despite echo contrast. 2. Normal LV size, normal wall thickness, low normal systolic function, normal LV diastolic function. 3. Left ventricular ejection fraction is approximately 50-55%. 4. Normal right ventricular size and normal systolic function. 5. There are no prior echocardiograms available for comparison. [id number] Barton Fanny MD Electronically signed by [id number] Barton Fanny MD on 02/05/2021 at 6:12:07 PM cc: Lashmeet Blas Final   Recent Labs: No results found for requested labs within last 8760 hours.  Recent Lipid Panel    Component Value Date/Time   CHOL 227 (H) 07/29/2020 1446    TRIG 131 07/29/2020 1446   HDL 64 07/29/2020 1446   CHOLHDL 3.5 07/29/2020 1446   CHOLHDL 4.6 02/02/2020 1010   VLDL 27 02/02/2020 1010   LDLCALC 140 (H) 07/29/2020 1446    Physical Exam:    VS:  BP 130/80 (BP Location: Right Arm, Patient Position: Sitting)   Pulse 89   Ht 5' (1.524 m)   Wt 152 lb 3.2 oz (69 kg)   SpO2 96%   BMI 29.72 kg/m     Wt Readings from Last 3 Encounters:  03/31/21 152 lb 3.2 oz (69 kg)  08/27/20 145 lb (65.8 kg)  07/29/20 159 lb (72.1 kg)     GEN:  Well nourished, well developed in no acute distress HEENT: Normal NECK: No JVD; No carotid bruits LYMPHATICS: No lymphadenopathy CARDIAC: RRR, no  murmurs, no rubs, no gallops RESPIRATORY:  Clear to auscultation without rales, wheezing or rhonchi  ABDOMEN: Soft, non-tender, non-distended MUSCULOSKELETAL:  No edema; No deformity  SKIN: Warm and dry LOWER EXTREMITIES: no swelling NEUROLOGIC:  Alert and oriented x 3 PSYCHIATRIC:  Normal affect   ASSESSMENT:    1. Coronary artery disease involving native coronary artery of native heart without angina pectoris   2. Obstructive sleep apnea   3. Syncope and collapse   4. Dyslipidemia    PLAN:    In order of problems listed above:  Coronary artery disease cardiac catheterization performed in the [facility] are reviewed.  It looks like she did not have non-STEMI or STEMI.  I think the best way to this Event did happen was acute coronary syndrome.  She was discharged home on aspirin.  I think she can benefit from doing from dual antiplatelet therapy, therefore I will initiate Plavix.  She likely is asymptomatic on antianginal therapy.  She is taking ranolazine as well as Imdur which I will continue for now. Dyslipidemia she is on Praluent as well as Crestor which I will continue.  I will check fasting lipid profile today. Obstructive sleep apnea: Followed by antimedicine team We did talk about need to have healthy lifestyle.  Medication Adjustments/Labs and  Tests Ordered: Current medicines are reviewed at length with the patient today.  Concerns regarding medicines are outlined above.  No orders of the defined types were placed in this encounter.  Medication changes: No orders of the defined types were placed in this encounter.   Signed, Park Liter, MD, [facility] 03/31/2021 8:34 AM    [place]

## 2021-04-01 LAB — LIPID PANEL
Chol/HDL Ratio: 2.7 ratio (ref 0.0–4.4)
Cholesterol, Total: 173 mg/dL (ref 100–199)
HDL: 64 mg/dL (ref >39)
LDL Chol Calc (NIH): 95 mg/dL (ref 0–99)
Triglycerides: 72 mg/dL (ref 0–149)
VLDL Cholesterol Cal: 14 mg/dL (ref 5–40)

## 2021-04-01 LAB — HEPATIC FUNCTION PANEL
ALT: 15 IU/L (ref 0–32)
AST: 16 IU/L (ref 0–40)
Albumin: 4.6 g/dL (ref 3.8–4.8)
Alkaline Phosphatase: 73 IU/L (ref 44–121)
Bilirubin Total: 0.6 mg/dL (ref 0.0–1.2)
Bilirubin, Direct: 0.15 mg/dL (ref 0.00–0.40)
Total Protein: 7.1 g/dL (ref 6.0–8.5)

## 2021-04-27 ENCOUNTER — Ambulatory Visit (INDEPENDENT_AMBULATORY_CARE_PROVIDER_SITE_OTHER): Payer: No Typology Code available for payment source

## 2021-04-27 DIAGNOSIS — R55 Syncope and collapse: Secondary | ICD-10-CM

## 2021-04-28 LAB — CUP PACEART REMOTE DEVICE CHECK
Date Time Interrogation Session: 20221202115958
Implantable Pulse Generator Implant Date: 20210921

## 2021-05-07 NOTE — Progress Notes (Signed)
[facility] Summary Report / Loop Recorder 

## 2021-06-01 ENCOUNTER — Ambulatory Visit (INDEPENDENT_AMBULATORY_CARE_PROVIDER_SITE_OTHER): Payer: No Typology Code available for payment source

## 2021-06-01 DIAGNOSIS — R55 Syncope and collapse: Secondary | ICD-10-CM | POA: Diagnosis not present

## 2021-06-01 LAB — CUP PACEART REMOTE DEVICE CHECK
Date Time Interrogation Session: 20230115230414
Implantable Pulse Generator Implant Date: 20210921

## 2021-06-12 NOTE — Progress Notes (Signed)
[facility] Summary Report / Loop Recorder 

## 2021-07-04 LAB — CUP PACEART REMOTE DEVICE CHECK
Date Time Interrogation Session: 20230217230200
Implantable Pulse Generator Implant Date: 20210921

## 2021-07-06 ENCOUNTER — Ambulatory Visit (INDEPENDENT_AMBULATORY_CARE_PROVIDER_SITE_OTHER): Payer: No Typology Code available for payment source

## 2021-07-06 ENCOUNTER — Ambulatory Visit: Payer: No Typology Code available for payment source | Admitting: Cardiology

## 2021-07-06 DIAGNOSIS — R55 Syncope and collapse: Secondary | ICD-10-CM

## 2021-07-13 NOTE — Progress Notes (Signed)
[facility] Summary Report / Loop Recorder 

## 2021-07-16 ENCOUNTER — Other Ambulatory Visit: Payer: Self-pay

## 2021-07-16 ENCOUNTER — Ambulatory Visit (INDEPENDENT_AMBULATORY_CARE_PROVIDER_SITE_OTHER): Payer: No Typology Code available for payment source | Admitting: Cardiology

## 2021-07-16 ENCOUNTER — Encounter: Payer: Self-pay | Admitting: Cardiology

## 2021-07-16 VITALS — BP 126/82 | HR 83 | Ht 61.0 in | Wt 156.2 lb

## 2021-07-16 DIAGNOSIS — R55 Syncope and collapse: Secondary | ICD-10-CM

## 2021-07-16 DIAGNOSIS — I2510 Atherosclerotic heart disease of native coronary artery without angina pectoris: Secondary | ICD-10-CM | POA: Diagnosis not present

## 2021-07-16 DIAGNOSIS — E785 Hyperlipidemia, unspecified: Secondary | ICD-10-CM

## 2021-07-16 NOTE — Progress Notes (Signed)
?Cardiology Office Note:   ? ?Date:  07/16/2021  ? ?ID:  Penny Boyer, DOB June 28, 1955, MRN [id number] ? ?PCP:  [facility]  ?Cardiologist:  Jenne Campus, MD   ? ?Referring MD: Ryanne Morand Lee  ? ?Chief Complaint  ?Patient presents with  ? Follow-up  ?I am doing fine ? ?History of Present Illness:   ? ?Penny Boyer is a 66 y.o. female with past medical history significant for coronary artery disease.  In 2017 she did have PTCA and stenting of the circumflex artery she also have history of dyslipidemia, obesity however he lost significant amount of weight being active at the gym as well as proper diet, obstructive sleep apnea, breast CA.  At the end of last year she end up being a [facility] start having chest discomfort at the airport was taken to [facility] cardiac catheterization was performed she was find to have 90% small diagonal branch disease.  It was felt to be too small to intervene she was discharged home with medications. ?She comes today 2 months for follow-up.  Overall she is doing well.  She is asymptomatic.  She denies have any chest pain tightness squeezing pressure burning chest.  She goes to gym on the regular basis exercise aggressively with no difficulties.  However exercise she does this usually weigh lifting rather than aerobic exercise. ? ?Past Medical History:  ?Diagnosis Date  ? Abnormal nuclear stress test 12/20/2018  ? Chest pain 02/02/2020  ? Coronary artery disease involving native coronary artery has post PTCA and stenting of the circumflex artery in March 2017 of native heart 07/28/2016  ? Overview:  PTCA and stent of LCX artery in March 2017 in [place]  ? Dyslipidemia (high LDL; low HDL) 07/28/2016  ? Hyperlipidemia   ? Hypertension   ? Kidney stone 10/17/2019  ? Precordial pain 07/28/2016  ? Syncope   ? ? ?Past Surgical History:  ?Procedure Laterality Date  ? ABDOMINAL HYSTERECTOMY    ? APPENDECTOMY    ? CARDIAC CATHETERIZATION    ? CHOLECYSTECTOMY    ? FOOT SURGERY    ?  INTRAVASCULAR PRESSURE WIRE/FFR STUDY N/A 12/20/2018  ? Procedure: INTRAVASCULAR PRESSURE WIRE/FFR STUDY;  Surgeon: Leonie Man, MD;  Location: [facility];  Service: Cardiovascular;  Laterality: N/A;  ? LEFT HEART CATH AND CORONARY ANGIOGRAPHY N/A 12/20/2018  ? Procedure: LEFT HEART CATH AND CORONARY ANGIOGRAPHY;  Surgeon: Leonie Man, MD;  Location: [facility];  Service: Cardiovascular;  Laterality: N/A;  ? MANDIBLE FRACTURE SURGERY    ? Ulnar Surgery    ? WRIST FRACTURE SURGERY    ? ? ?Current Medications: ?Current Meds  ?Medication Sig  ? Alirocumab (PRALUENT) 150 MG/ML SOAJ Inject 1 Dose into the skin every 14 (fourteen) days.  ? aspirin EC 81 MG tablet Take 81 mg by mouth daily. Swallow whole.  ? busPIRone (BUSPAR) 10 MG tablet Take 5 mg by mouth 3 (three) times daily as needed (amxiety). Take 1/2 tablet 3 times daily  ? clopidogrel (PLAVIX) 75 MG tablet Take 1 tablet (75 mg total) by mouth daily.  ? isosorbide mononitrate (IMDUR) 120 MG 24 hr tablet Take 1 tablet (120 mg total) by mouth daily.  ? losartan (COZAAR) 100 MG tablet Take 1 tablet (100 mg total) by mouth daily.  ? nitroGLYCERIN (NITROSTAT) 0.4 MG SL tablet Place 1 tablet (0.4 mg total) under the tongue as needed for chest pain.  ? ranolazine (RANEXA) 1000 MG SR tablet  Take 1 tablet (1,000 mg total) by mouth 2 (two) times daily.  ? rosuvastatin (CRESTOR) 5 MG tablet Take 1 tablet (5 mg total) by mouth daily.  ?  ? ?Allergies:   Atorvastatin and Hydrochlorothiazide w-triamterene  ? ?Social History  ? ?Socioeconomic History  ? Marital status: Married  ?  Spouse name: Not on file  ? Number of children: Not on file  ? Years of education: Not on file  ? Highest education level: Not on file  ?Occupational History  ? Not on file  ?Tobacco Use  ? Smoking status: Former  ? Smokeless tobacco: Never  ?Vaping Use  ? Vaping Use: Never used  ?Substance and Sexual Activity  ? Alcohol use: Yes  ? Drug use: No  ? Sexual activity: Not on file   ?Other Topics Concern  ? Not on file  ?Social History Narrative  ? Not on file  ? ?Social Determinants of Health  ? ?Financial Resource Strain: Not on file  ?Food Insecurity: Not on file  ?Transportation Needs: Not on file  ?Physical Activity: Not on file  ?Stress: Not on file  ?Social Connections: Not on file  ?  ? ?Family History: ?The patient's family history includes Cancer in her mother; Heart disease in her brother, brother, and father. ?ROS:   ?Please see the history of present illness.    ?All 14 point review of systems negative except as described per history of present illness ? ?EKGs/Labs/Other Studies Reviewed:   ? ? ? ?Recent Labs: ?03/31/2021: ALT 15  ?Recent Lipid Panel ?   ?Component Value Date/Time  ? CHOL 173 03/31/2021 0848  ? TRIG 72 03/31/2021 0848  ? HDL 64 03/31/2021 0848  ? CHOLHDL 2.7 03/31/2021 0848  ? CHOLHDL 4.6 02/02/2020 1010  ? VLDL 27 02/02/2020 1010  ? [place] 95 03/31/2021 0848  ? ? ?Physical Exam:   ? ?VS:  BP 126/82 (BP Location: Left Arm, Patient Position: Sitting)   Pulse 83   Ht 5\' 1"  (1.549 m)   Wt 156 lb 3.2 oz (70.9 kg)   SpO2 92%   BMI 29.51 kg/m?    ? ?Wt Readings from Last 3 Encounters:  ?07/16/21 156 lb 3.2 oz (70.9 kg)  ?03/31/21 152 lb 3.2 oz (69 kg)  ?08/27/20 145 lb (65.8 kg)  ?  ? ?GEN:  Well nourished, well developed in no acute distress ?HEENT: Normal ?NECK: No JVD; No carotid bruits ?LYMPHATICS: No lymphadenopathy ?CARDIAC: RRR, no murmurs, no rubs, no gallops ?RESPIRATORY:  Clear to auscultation without rales, wheezing or rhonchi  ?ABDOMEN: Soft, non-tender, non-distended ?MUSCULOSKELETAL:  No edema; No deformity  ?SKIN: Warm and dry ?LOWER EXTREMITIES: no swelling ?NEUROLOGIC:  Alert and oriented x 3 ?PSYCHIATRIC:  Normal affect  ? ?ASSESSMENT:   ? ?1. Coronary artery disease involving native coronary artery of native heart without angina pectoris   ?2. Pre-syncope   ?3. Dyslipidemia   ? ?PLAN:   ? ?In order of problems listed above: ? ?Coronary disease  stable from that point review.  She is on dual antiplatelet therapy which I will continue for a year after event that she had.  Look like stable asymptomatic. ?Presyncope.  She does have implantable loop recorder I did review interrogation there is no arrhythmia detected. ?Dyslipidemia I did review K PN which show LDL 95 HDL 64.  She is already on Praluent as well as Crestor.  We will continue. ?We talked about healthy lifestyle need to exercise on the regular basis and good  diet. ? ? ?Medication Adjustments/Labs and Tests Ordered: ?Current medicines are reviewed at length with the patient today.  Concerns regarding medicines are outlined above.  ?No orders of the defined types were placed in this encounter. ? ?Medication changes: No orders of the defined types were placed in this encounter. ? ? ?Signed, ?Park Liter, MD, [facility] ?07/16/2021 8:41 AM    ?[place] ?

## 2021-07-16 NOTE — Patient Instructions (Signed)

## 2021-08-10 ENCOUNTER — Ambulatory Visit (INDEPENDENT_AMBULATORY_CARE_PROVIDER_SITE_OTHER): Payer: No Typology Code available for payment source

## 2021-08-10 DIAGNOSIS — R55 Syncope and collapse: Secondary | ICD-10-CM

## 2021-08-11 LAB — CUP PACEART REMOTE DEVICE CHECK
Date Time Interrogation Session: 20230326231240
Implantable Pulse Generator Implant Date: 20210921

## 2021-08-21 NOTE — Progress Notes (Signed)
[facility] Summary Report / Loop Recorder 

## 2021-09-04 ENCOUNTER — Telehealth: Payer: Self-pay | Admitting: Cardiology

## 2021-09-04 NOTE — Telephone Encounter (Signed)
Penny Boyer from the [place] called requesting the OV notes from the referral they sent over in December. Patient was seen on 03/02 fax number is [phone]. Please advise.  ?

## 2021-09-14 ENCOUNTER — Ambulatory Visit (INDEPENDENT_AMBULATORY_CARE_PROVIDER_SITE_OTHER): Payer: No Typology Code available for payment source

## 2021-09-14 DIAGNOSIS — R55 Syncope and collapse: Secondary | ICD-10-CM | POA: Diagnosis not present

## 2021-09-14 LAB — CUP PACEART REMOTE DEVICE CHECK
Date Time Interrogation Session: 20230428230351
Implantable Pulse Generator Implant Date: 20210921

## 2021-09-28 NOTE — Progress Notes (Signed)
[facility] Summary Report / Loop Recorder 

## 2021-10-19 ENCOUNTER — Ambulatory Visit (INDEPENDENT_AMBULATORY_CARE_PROVIDER_SITE_OTHER): Payer: No Typology Code available for payment source

## 2021-10-19 DIAGNOSIS — R55 Syncope and collapse: Secondary | ICD-10-CM | POA: Diagnosis not present

## 2021-10-19 LAB — CUP PACEART REMOTE DEVICE CHECK
Date Time Interrogation Session: 20230531230510
Implantable Pulse Generator Implant Date: 20210921

## 2021-11-03 ENCOUNTER — Other Ambulatory Visit (HOSPITAL_COMMUNITY): Payer: Self-pay | Admitting: Physician Assistant

## 2021-11-03 ENCOUNTER — Other Ambulatory Visit: Payer: Self-pay | Admitting: Physician Assistant

## 2021-11-03 DIAGNOSIS — M25562 Pain in left knee: Secondary | ICD-10-CM

## 2021-11-05 NOTE — Progress Notes (Signed)
[facility] Summary Report / Loop Recorder 

## 2021-11-11 ENCOUNTER — Telehealth: Payer: Self-pay | Admitting: Cardiology

## 2021-11-11 MED ORDER — LOSARTAN POTASSIUM 100 MG PO TABS: 100.0000 mg | ORAL_TABLET | Freq: Every day | ORAL | 3 refills | Status: DC

## 2021-11-11 MED ORDER — ISOSORBIDE MONONITRATE ER 120 MG PO TB24: 120.0000 mg | ORAL_TABLET | Freq: Every day | ORAL | 3 refills | Status: DC

## 2021-11-11 MED ORDER — CLOPIDOGREL BISULFATE 75 MG PO TABS: 75.0000 mg | ORAL_TABLET | Freq: Every day | ORAL | 1 refills | Status: DC

## 2021-11-11 MED ORDER — RANOLAZINE ER 1000 MG PO TB12: 1000.0000 mg | ORAL_TABLET | Freq: Two times a day (BID) | ORAL | 3 refills | Status: DC

## 2021-11-11 NOTE — Telephone Encounter (Signed)
Medications sent, pharmacy notified

## 2021-11-11 NOTE — Telephone Encounter (Signed)
 *  STAT* If patient is at the pharmacy, call can be transferred to refill team.   1. Which medications need to be refilled? (please list name of each medication and dose if known) clopidogrel (PLAVIX) 75 MG tablet isosorbide mononitrate (IMDUR) 120 MG 24 hr tablet losartan (COZAAR) 100 MG tablet ranolazine (RANEXA) 1000 MG SR tablet  2. Which pharmacy/location (including street and city if local pharmacy) is medication to be sent to? [place], [place].  3. Do they need a 30 day or 90 day supply? 90 days  Pt is almost out of meds needs refill today

## 2021-11-14 IMAGING — CT CT HEAD W/O CM
4 series · 16 of 47 positions shown, 18 images · non-contrast
Comparison: None.

CLINICAL DATA: Dizziness.  Recurrent syncopal episodes.

EXAM:
CT HEAD WITHOUT CONTRAST
TECHNIQUE: Contiguous axial images were obtained from the base of the skull
through the vertex without intravenous contrast.

[Series 3: head wo · axial · 0.41mm/px · z∈[-82,+23]mm · 7 of 29 slices shown, 9 images]
[im 4/29  brain]
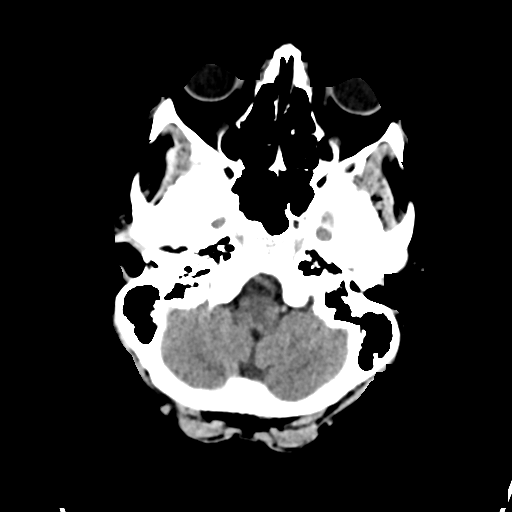
[im 4/29  bone]
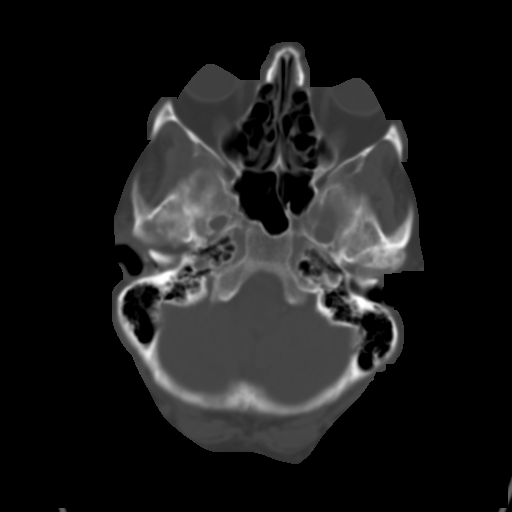
[im 8/29  brain]
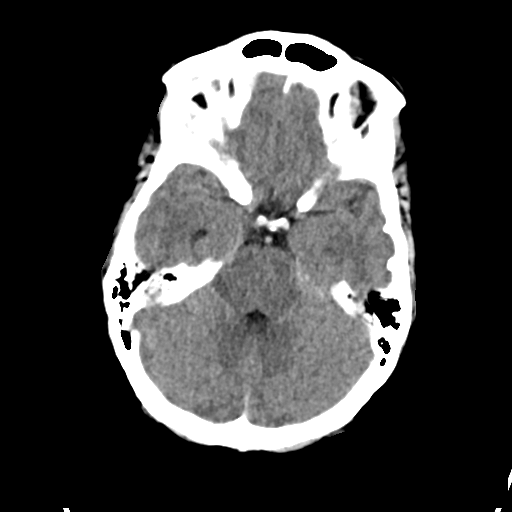
[im 11/29  brain]
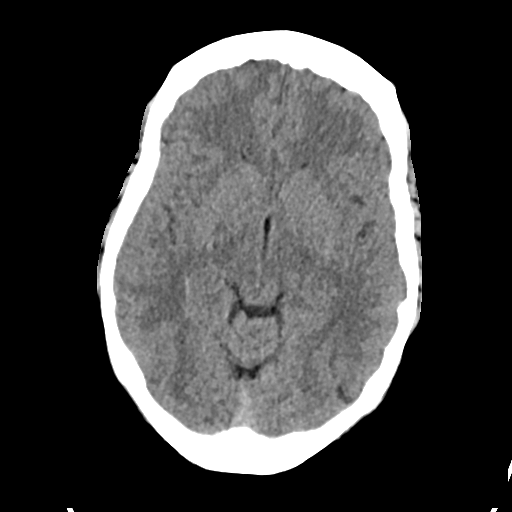
[im 15/29  brain]
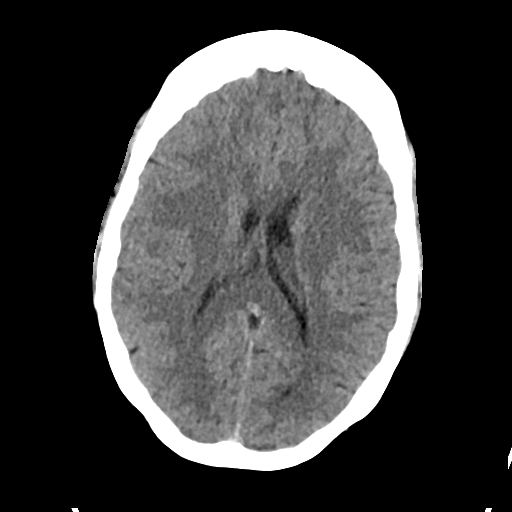
[im 18/29  brain]
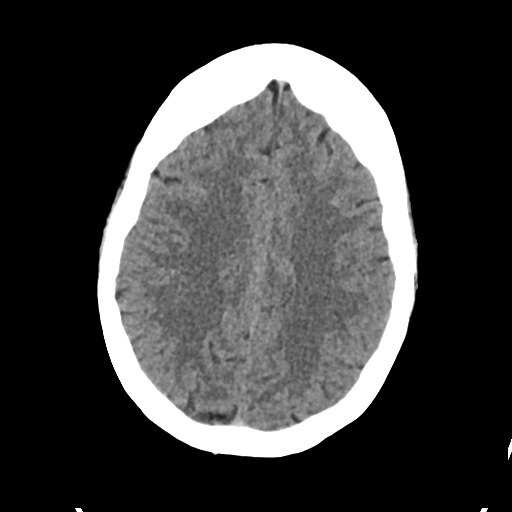
[im 18/29  bone]
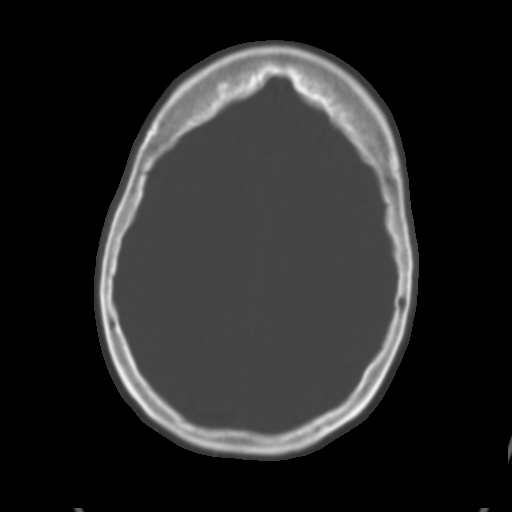
[im 22/29  brain]
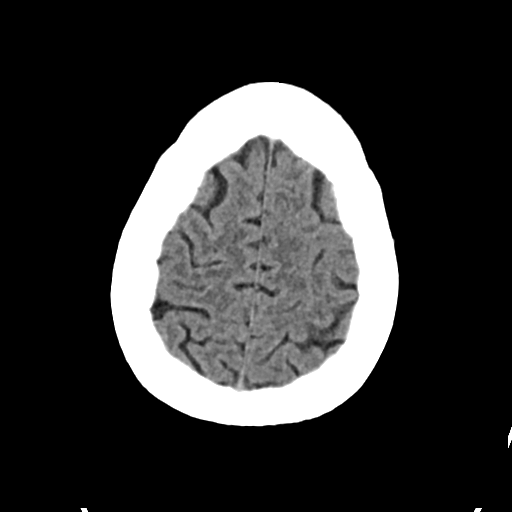
[im 25/29  brain]
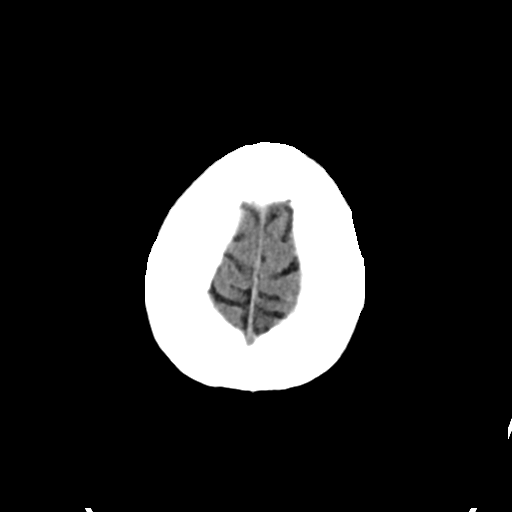

[Series 4: head bone · axial · 0.41mm/px · z∈[-83,-55]mm · 3 of 71 slices shown]
[im 8/71  bone]
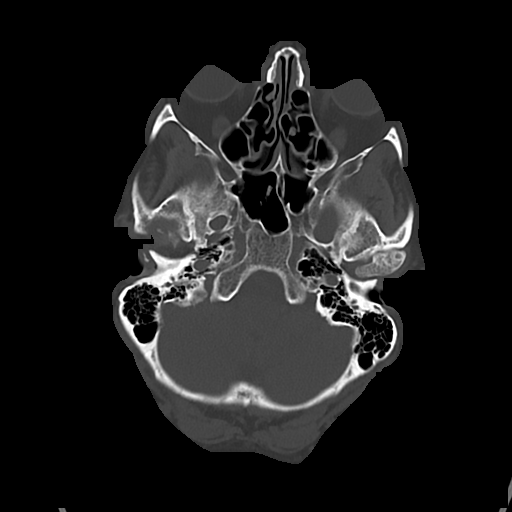
[im 15/71  bone]
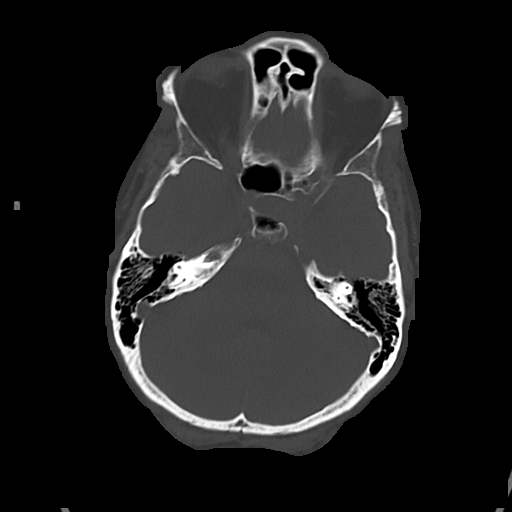
[im 22/71  bone]
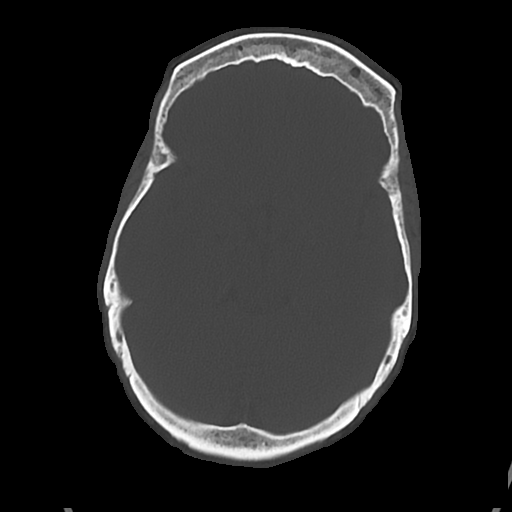

[Series 5: cor soft · coronal · 0.30mm/px · 3 of 65 slices shown]
[im 22/65  brain]
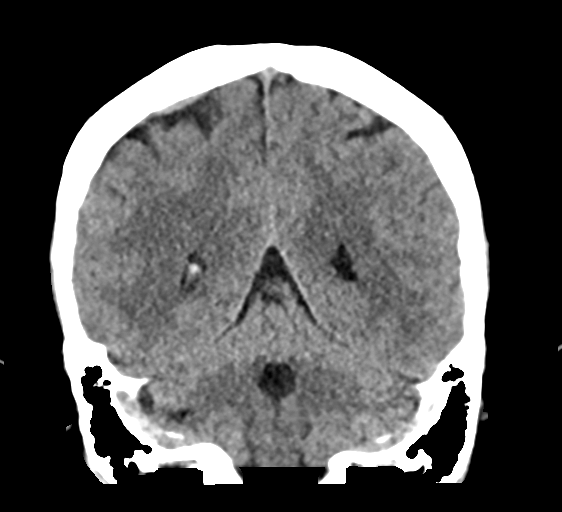
[im 29/65  brain]
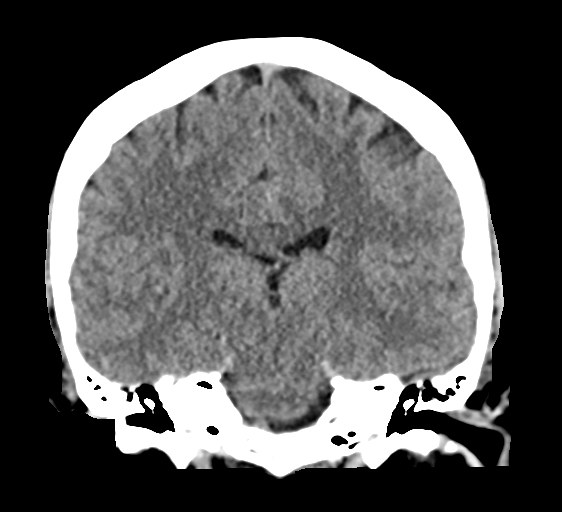
[im 36/65  brain]
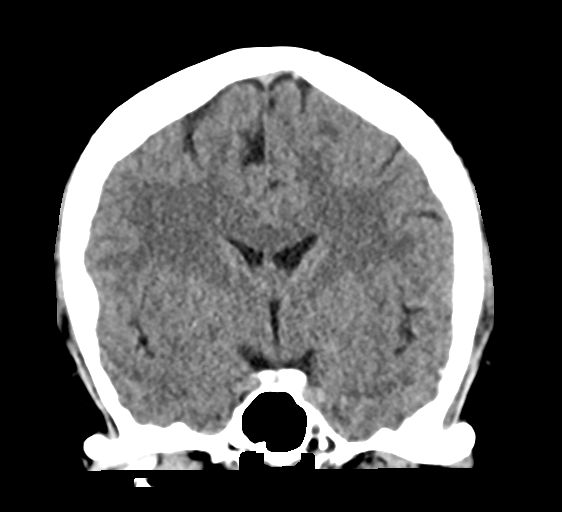

[Series 6: sag soft · sagittal · 0.30mm/px · 3 of 52 slices shown]
[im 18/52  brain]
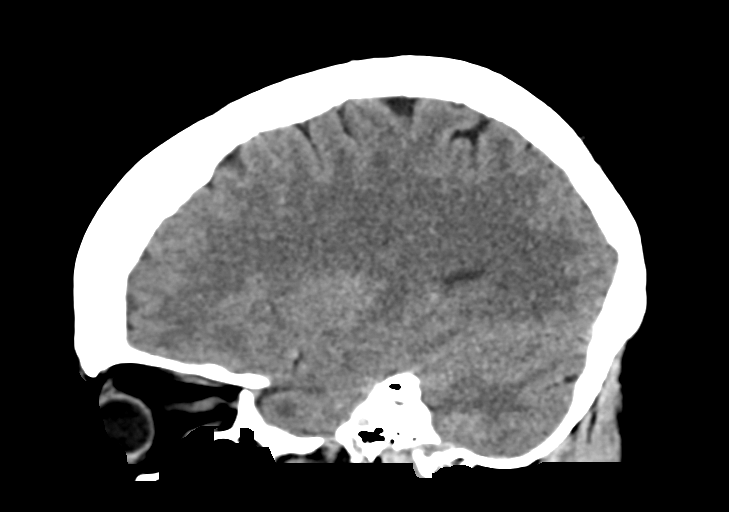
[im 26/52  brain]
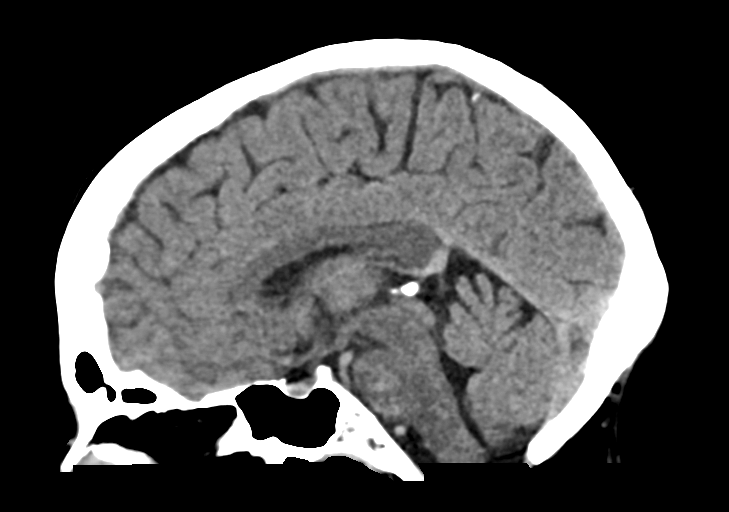
[im 35/52  brain]
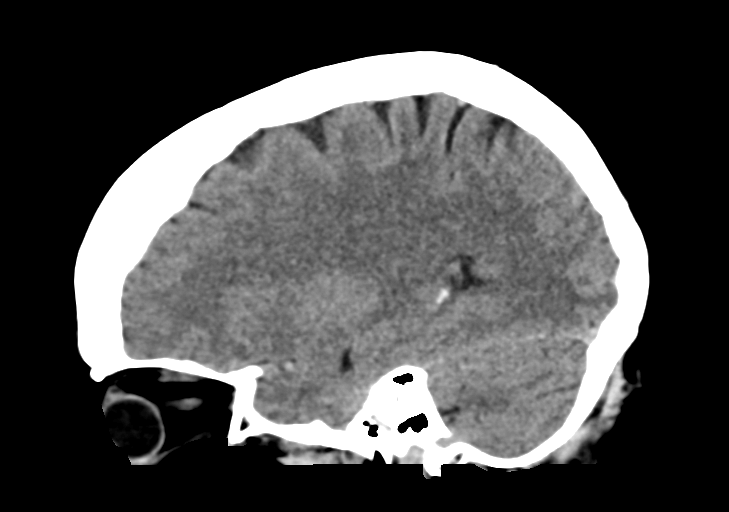

[16 of 47 positions shown; findings below may reference images not displayed]

FINDINGS: Brain: No evidence of acute infarction, hemorrhage, hydrocephalus,
extra-axial collection or mass lesion/mass effect.

Vascular: No hyperdense vessel or unexpected calcification.

Skull: Normal. Negative for fracture or focal lesion.

Sinuses/Orbits: No acute finding.

Other: None.
IMPRESSION: Normal study.  No cause for dizziness or syncope identified.

## 2021-11-23 ENCOUNTER — Ambulatory Visit (HOSPITAL_COMMUNITY)
Admission: RE | Admit: 2021-11-23 | Discharge: 2021-11-23 | Disposition: A | Discharge status: Home / Self Care | Payer: No Typology Code available for payment source | Source: Ambulatory Visit | Attending: Physician Assistant | Admitting: Physician Assistant

## 2021-11-23 ENCOUNTER — Ambulatory Visit (INDEPENDENT_AMBULATORY_CARE_PROVIDER_SITE_OTHER): Payer: No Typology Code available for payment source

## 2021-11-23 DIAGNOSIS — R55 Syncope and collapse: Secondary | ICD-10-CM

## 2021-11-23 DIAGNOSIS — M25562 Pain in left knee: Secondary | ICD-10-CM

## 2021-11-23 LAB — CUP PACEART REMOTE DEVICE CHECK
Date Time Interrogation Session: 20230703231231
Implantable Pulse Generator Implant Date: 20210921

## 2021-11-23 MED ORDER — SODIUM CHLORIDE (PF) 0.9 % IJ SOLN
10.0000 mL | Freq: Once | INTRAMUSCULAR | Status: AC
  Administered 2021-11-23: 10 mL

## 2021-11-23 MED ORDER — IOHEXOL 180 MG/ML  SOLN
10.0000 mL | Freq: Once | INTRAMUSCULAR | Status: AC | PRN
Start: 2021-11-23 — End: 2021-11-23
  Administered 2021-11-23: 10 mL via INTRA_ARTICULAR

## 2021-11-23 MED ORDER — LIDOCAINE HCL (PF) 1 % IJ SOLN
5.0000 mL | Freq: Once | INTRAMUSCULAR | Status: AC
  Administered 2021-11-23: 5 mL via INTRADERMAL

## 2021-12-03 ENCOUNTER — Other Ambulatory Visit: Payer: Self-pay

## 2021-12-03 ENCOUNTER — Telehealth: Payer: Self-pay | Admitting: Cardiology

## 2021-12-03 MED ORDER — CLOPIDOGREL BISULFATE 75 MG PO TABS: 75.0000 mg | ORAL_TABLET | Freq: Every day | ORAL | 1 refills | Status: DC

## 2021-12-03 MED ORDER — RANOLAZINE ER 1000 MG PO TB12: 1000.0000 mg | ORAL_TABLET | Freq: Two times a day (BID) | ORAL | 3 refills | Status: DC

## 2021-12-03 MED ORDER — ISOSORBIDE MONONITRATE ER 120 MG PO TB24: 120.0000 mg | ORAL_TABLET | Freq: Every day | ORAL | 3 refills | Status: DC

## 2021-12-03 MED ORDER — LOSARTAN POTASSIUM 100 MG PO TABS: 100.0000 mg | ORAL_TABLET | Freq: Every day | ORAL | 3 refills | Status: DC

## 2021-12-03 NOTE — Telephone Encounter (Signed)
   Pt is calling back to f/u refill. Refill was sent to wrong pharmacy. Pre request need to send it to [place], [place].

## 2021-12-03 NOTE — Telephone Encounter (Signed)
Sent refills #90 ref x 3 to [facility] clopidogrel (PLAVIX) 75 MG tablet, isosorbide mononitrate (IMDUR) 120 MG 24 hr tablet, losartan (COZAAR) 100 MG tablet, ranolazine (RANEXA) 1000 MG SR tablet

## 2021-12-03 NOTE — Telephone Encounter (Signed)
  Patient calling the office for samples of medication:   1.  What medication and dosage are you requesting samples for? ranolazine (RANEXA) 1000 MG SR tablet  2.  Are you currently out of this medication? Yes,   Pt said, she's been out of this meds for 2 weeks now and her meds was just sent to [facility] today and will take them a week to send her meds, she asked for samples. She requested if someone can call her today

## 2021-12-03 NOTE — Telephone Encounter (Signed)
Pt called stating that her prescription had been sent to the wrong pharmacy when she called in June. She did not find out until she did not have any and found out that the [place] did not have a prescription. She was asking for samples. Advised that we did not have any. The Ranexa was sent to the correct pharmacy [facility] in [place] earlier today.

## 2021-12-21 NOTE — Progress Notes (Signed)
[facility] Summary Report / Loop Recorder 

## 2021-12-24 LAB — CUP PACEART REMOTE DEVICE CHECK
Date Time Interrogation Session: 20230805230841
Implantable Pulse Generator Implant Date: 20210921

## 2022-01-25 ENCOUNTER — Ambulatory Visit: Payer: No Typology Code available for payment source | Attending: Cardiology | Admitting: Cardiology

## 2022-02-01 ENCOUNTER — Ambulatory Visit (INDEPENDENT_AMBULATORY_CARE_PROVIDER_SITE_OTHER): Payer: No Typology Code available for payment source

## 2022-02-01 DIAGNOSIS — R55 Syncope and collapse: Secondary | ICD-10-CM

## 2022-02-02 LAB — CUP PACEART REMOTE DEVICE CHECK
Date Time Interrogation Session: 20230917231627
Implantable Pulse Generator Implant Date: 20210921

## 2022-02-16 NOTE — Progress Notes (Signed)
[facility] Summary Report / Loop Recorder 

## 2022-03-08 ENCOUNTER — Ambulatory Visit (INDEPENDENT_AMBULATORY_CARE_PROVIDER_SITE_OTHER): Payer: No Typology Code available for payment source

## 2022-03-08 DIAGNOSIS — R55 Syncope and collapse: Secondary | ICD-10-CM

## 2022-03-09 LAB — CUP PACEART REMOTE DEVICE CHECK
Date Time Interrogation Session: 20231020230458
Implantable Pulse Generator Implant Date: 20210921

## 2022-04-05 NOTE — Progress Notes (Signed)
[facility] Summary Report / Loop Recorder 

## 2022-04-12 ENCOUNTER — Ambulatory Visit (INDEPENDENT_AMBULATORY_CARE_PROVIDER_SITE_OTHER): Payer: No Typology Code available for payment source

## 2022-04-12 DIAGNOSIS — R55 Syncope and collapse: Secondary | ICD-10-CM

## 2022-04-13 LAB — CUP PACEART REMOTE DEVICE CHECK
Date Time Interrogation Session: 20231126231931
Implantable Pulse Generator Implant Date: 20210921

## 2022-04-15 ENCOUNTER — Telehealth: Payer: Self-pay | Admitting: Internal Medicine

## 2022-04-15 ENCOUNTER — Telehealth: Payer: Self-pay | Admitting: Cardiology

## 2022-04-15 MED ORDER — LOSARTAN POTASSIUM 100 MG PO TABS: 100.0000 mg | ORAL_TABLET | Freq: Every day | ORAL | 0 refills | Status: DC

## 2022-04-15 MED ORDER — RANOLAZINE ER 1000 MG PO TB12: 1000.0000 mg | ORAL_TABLET | Freq: Two times a day (BID) | ORAL | 0 refills | Status: DC

## 2022-04-15 MED ORDER — ISOSORBIDE MONONITRATE ER 120 MG PO TB24: 120.0000 mg | ORAL_TABLET | Freq: Every day | ORAL | 0 refills | Status: DC

## 2022-04-15 MED ORDER — CLOPIDOGREL BISULFATE 75 MG PO TABS: 75.0000 mg | ORAL_TABLET | Freq: Every day | ORAL | 0 refills | Status: DC

## 2022-04-15 NOTE — Telephone Encounter (Signed)
Refills of Ranexa 1000 mg, Imdur 120 mg, Losartan 100 mg, and Plavix 75 mg sent to [facility].

## 2022-04-15 NOTE — Telephone Encounter (Signed)
*  STAT* If patient is at the pharmacy, call can be transferred to refill team.   1. Which medications need to be refilled? (please list name of each medication and dose if known) Alirocumab (PRALUENT) 150 MG/ML SOAJ   2. Which pharmacy/location (including street and city if local pharmacy) is medication to be sent to?  [place], [place].    3. Do they need a 30 day or 90 day supply? [place]

## 2022-04-15 NOTE — Telephone Encounter (Signed)
*  STAT* If patient is at the pharmacy, call can be transferred to refill team.   1. Which medications need to be refilled? (please list name of each medication and dose if known)   ranolazine (RANEXA) 1000 MG SR tablet  isosorbide mononitrate (IMDUR) 120 MG 24 hr tablet  losartan (COZAAR) 100 MG tablet   clopidogrel (PLAVIX) 75 MG tablet   2. Which pharmacy/location (including street and city if local pharmacy) is medication to be sent to?  [place], [place].    3. Do they need a 30 day or 90 day supply? [place]

## 2022-04-16 MED ORDER — PRALUENT 150 MG/ML ~~LOC~~ SOAJ: 1.0000 | SUBCUTANEOUS | 3 refills | Status: DC

## 2022-05-18 ENCOUNTER — Ambulatory Visit (INDEPENDENT_AMBULATORY_CARE_PROVIDER_SITE_OTHER): Payer: No Typology Code available for payment source

## 2022-05-18 DIAGNOSIS — R55 Syncope and collapse: Secondary | ICD-10-CM

## 2022-05-18 LAB — CUP PACEART REMOTE DEVICE CHECK
Date Time Interrogation Session: 20240101231408
Implantable Pulse Generator Implant Date: 20210921

## 2022-05-24 NOTE — Progress Notes (Signed)
[facility] Summary Report / Loop Recorder 

## 2022-06-10 ENCOUNTER — Ambulatory Visit: Payer: No Typology Code available for payment source | Admitting: Cardiology

## 2022-06-18 NOTE — Progress Notes (Signed)
[facility] Summary Report / Loop Recorder 

## 2022-06-20 LAB — CUP PACEART REMOTE DEVICE CHECK
Date Time Interrogation Session: 20240203231618
Implantable Pulse Generator Implant Date: 20210921

## 2022-06-21 ENCOUNTER — Ambulatory Visit: Payer: No Typology Code available for payment source

## 2022-06-21 DIAGNOSIS — R55 Syncope and collapse: Secondary | ICD-10-CM

## 2022-07-26 ENCOUNTER — Ambulatory Visit (INDEPENDENT_AMBULATORY_CARE_PROVIDER_SITE_OTHER): Payer: No Typology Code available for payment source

## 2022-07-26 DIAGNOSIS — R55 Syncope and collapse: Secondary | ICD-10-CM

## 2022-07-28 LAB — CUP PACEART REMOTE DEVICE CHECK
Date Time Interrogation Session: 20240310232113
Implantable Pulse Generator Implant Date: 20210921

## 2022-08-05 NOTE — Progress Notes (Signed)
[facility] Summary Report / Loop Recorder 

## 2022-08-30 ENCOUNTER — Ambulatory Visit (INDEPENDENT_AMBULATORY_CARE_PROVIDER_SITE_OTHER): Payer: No Typology Code available for payment source

## 2022-08-30 DIAGNOSIS — R55 Syncope and collapse: Secondary | ICD-10-CM | POA: Diagnosis not present

## 2022-08-31 LAB — CUP PACEART REMOTE DEVICE CHECK
Date Time Interrogation Session: 20240414231550
Implantable Pulse Generator Implant Date: 20210921

## 2022-09-06 NOTE — Progress Notes (Signed)
[facility] Summary Report / Loop Recorder 

## 2022-09-13 ENCOUNTER — Encounter: Payer: Self-pay | Admitting: Cardiology

## 2022-09-13 ENCOUNTER — Ambulatory Visit: Payer: No Typology Code available for payment source | Attending: Cardiology | Admitting: Cardiology

## 2022-09-13 VITALS — BP 108/60 | HR 79 | Ht 61.0 in | Wt 184.2 lb

## 2022-09-13 DIAGNOSIS — M79672 Pain in left foot: Secondary | ICD-10-CM | POA: Insufficient documentation

## 2022-09-13 DIAGNOSIS — E785 Hyperlipidemia, unspecified: Secondary | ICD-10-CM

## 2022-09-13 DIAGNOSIS — I2510 Atherosclerotic heart disease of native coronary artery without angina pectoris: Secondary | ICD-10-CM

## 2022-09-13 DIAGNOSIS — R072 Precordial pain: Secondary | ICD-10-CM

## 2022-09-13 DIAGNOSIS — R55 Syncope and collapse: Secondary | ICD-10-CM

## 2022-09-13 DIAGNOSIS — M5459 Other low back pain: Secondary | ICD-10-CM | POA: Insufficient documentation

## 2022-09-13 DIAGNOSIS — M25562 Pain in left knee: Secondary | ICD-10-CM | POA: Insufficient documentation

## 2022-09-13 DIAGNOSIS — R0609 Other forms of dyspnea: Secondary | ICD-10-CM

## 2022-09-13 MED ORDER — RANOLAZINE ER 1000 MG PO TB12: 1000.0000 mg | ORAL_TABLET | Freq: Two times a day (BID) | ORAL | 0 refills | Status: DC

## 2022-09-13 MED ORDER — NITROGLYCERIN 0.4 MG SL SUBL: 0.4000 mg | SUBLINGUAL_TABLET | SUBLINGUAL | 5 refills | Status: DC | PRN

## 2022-09-13 MED ORDER — LOSARTAN POTASSIUM 100 MG PO TABS: 100.0000 mg | ORAL_TABLET | Freq: Every day | ORAL | 3 refills | Status: DC

## 2022-09-13 MED ORDER — CLOPIDOGREL BISULFATE 75 MG PO TABS: 75.0000 mg | ORAL_TABLET | Freq: Every day | ORAL | 3 refills | Status: DC

## 2022-09-13 MED ORDER — RANOLAZINE ER 1000 MG PO TB12: 1000.0000 mg | ORAL_TABLET | Freq: Two times a day (BID) | ORAL | 3 refills | Status: DC

## 2022-09-13 MED ORDER — ISOSORBIDE MONONITRATE ER 120 MG PO TB24: 120.0000 mg | ORAL_TABLET | Freq: Every day | ORAL | 0 refills | Status: DC

## 2022-09-13 MED ORDER — CLOPIDOGREL BISULFATE 75 MG PO TABS: 75.0000 mg | ORAL_TABLET | Freq: Every day | ORAL | 0 refills | Status: DC

## 2022-09-13 MED ORDER — ISOSORBIDE MONONITRATE ER 120 MG PO TB24: 120.0000 mg | ORAL_TABLET | Freq: Every day | ORAL | 3 refills | Status: DC

## 2022-09-13 MED ORDER — ROSUVASTATIN CALCIUM 5 MG PO TABS: 5.0000 mg | ORAL_TABLET | Freq: Every day | ORAL | 3 refills | Status: DC

## 2022-09-13 NOTE — Patient Instructions (Addendum)
Medication Instructions:  Nitroglycerin Use nitroglycerin 1 tablet placed under the tongue at the first sign of chest pain or an angina attack. 1 tablet may be used every 5 minutes as needed, for up to 15 minutes. Do not take more than 3 tablets in 15 minutes. If pain persist call 911 or go to the nearest ED.     Lab Work: None Ordered If you have labs (blood work) drawn today and your tests are completely normal, you will receive your results only by: MyChart Message (if you have MyChart) OR A paper copy in the mail If you have any lab test that is abnormal or we need to change your treatment, we will call you to review the results.   Testing/Procedures: Your physician has requested that you have a lexiscan myoview. For further information please visit [email or web address] Please follow instruction sheet, as given.  The test will take approximately 3 to 4 hours to complete; you may bring reading material.  If someone comes with you to your appointment, they will need to remain in the main lobby due to limited space in the testing area. **If you are pregnant or breastfeeding, please notify the nuclear lab prior to your appointment**  How to prepare for your Myocardial Perfusion Test: Do not eat or drink 3 hours prior to your test, except you may have water. Do not consume products containing caffeine (regular or decaffeinated) 12 hours prior to your test. (ex: coffee, chocolate, sodas, tea). Do bring a list of your current medications with you.  If not listed below, you may take your medications as normal. Do wear comfortable clothes (no dresses or overalls) and walking shoes, tennis shoes preferred (No heels or open toe shoes are allowed). Do NOT wear cologne, perfume, aftershave, or lotions (deodorant is allowed). If these instructions are not followed, your test will have to be rescheduled.    Your physician has requested that you have an echocardiogram. Echocardiography is a painless  test that uses sound waves to create images of your heart. It provides your doctor with information about the size and shape of your heart and how well your heart's chambers and valves are working. This procedure takes approximately one hour. There are no restrictions for this procedure. Please do NOT wear cologne, perfume, aftershave, or lotions (deodorant is allowed). Please arrive 15 minutes prior to your appointment time.    Follow-Up: At [facility], you and your health needs are our priority.  As part of our continuing mission to provide you with exceptional heart care, we have created designated Provider Care Teams.  These Care Teams include your primary Cardiologist (physician) and Advanced Practice Providers (APPs -  Physician Assistants and Nurse Practitioners) who all work together to provide you with the care you need, when you need it.  We recommend signing up for the patient portal called "MyChart".  Sign up information is provided on this After Visit Summary.  MyChart is used to connect with patients for Virtual Visits (Telemedicine).  Patients are able to view lab/test results, encounter notes, upcoming appointments, etc.  Non-urgent messages can be sent to your provider as well.   To learn more about what you can do with MyChart, go to [email or web address].    Your next appointment:   3 month(s)  The format for your next appointment:   In Person  Provider:   Gypsy Balsam, MD    Other Instructions NA

## 2022-09-13 NOTE — Progress Notes (Signed)
Cardiology Office Note:    Date:  09/13/2022   ID:  Penny Boyer, DOB [birth year], MRN [id number]  PCP:  [facility]  Cardiologist:  Gypsy Balsam, MD    Referring MD: [facility]   Chief Complaint  Patient presents with   Medication Refill    All cardiac MED     History of Present Illness:    Penny Boyer is a 67 y.o. female  with past medical history significant for coronary artery disease. In 2017 she did have PTCA and stenting of the circumflex artery she also have history of dyslipidemia, obesity however he lost significant amount of weight being active at the gym as well as proper diet, obstructive sleep apnea, breast CA. At the end of last year she end up being a [facility] start [place] chest [place] was taken to [facility] cardiac catheterization was performed she was find to have 90% small diagonal branch disease. It was felt to be too small to intervene she was discharged home with medications.  Comes today to months for follow-up.  Overall doing fair complain of having some chest pain sometimes when she gets upset.  Also described to have some shortness of breath.  She is upset with herself because she gained significant amount of weight she stopped exercising on the regular basis.  She blames this on her stress.  Past Medical History:  Diagnosis Date   Abnormal nuclear stress test 12/20/2018   Chest pain 02/02/2020   Coronary artery disease involving native coronary artery has post PTCA and stenting of the circumflex artery in March 2017 of native heart 07/28/2016   Overview:  PTCA and stent of LCX artery in March 2017 in [place]   Dyslipidemia (high LDL; low HDL) 07/28/2016   Hyperlipidemia    Hypertension    Kidney stone 10/17/2019   Precordial pain 07/28/2016   Syncope     Past Surgical History:  Procedure Laterality Date   ABDOMINAL HYSTERECTOMY     APPENDECTOMY     CARDIAC CATHETERIZATION     CHOLECYSTECTOMY     CORONARY  PRESSURE/FFR STUDY N/A 12/20/2018   Procedure: INTRAVASCULAR PRESSURE WIRE/FFR STUDY;  Surgeon: Marykay Lex, MD;  Location: [facility] INVASIVE CV LAB;  Service: Cardiovascular;  Laterality: N/A;   FOOT SURGERY     LEFT HEART CATH AND CORONARY ANGIOGRAPHY N/A 12/20/2018   Procedure: LEFT HEART CATH AND CORONARY ANGIOGRAPHY;  Surgeon: Marykay Lex, MD;  Location: [facility] INVASIVE CV LAB;  Service: Cardiovascular;  Laterality: N/A;   MANDIBLE FRACTURE SURGERY     Ulnar Surgery     WRIST FRACTURE SURGERY      Current Medications: Current Meds  Medication Sig   Alirocumab (PRALUENT) 150 MG/ML SOAJ Inject 1 Dose into the skin every 14 (fourteen) days.   aspirin EC 81 MG tablet Take 81 mg by mouth daily. Swallow whole.   busPIRone (BUSPAR) 10 MG tablet Take 5 mg by mouth 3 (three) times daily as needed (amxiety). Take 1/2 tablet 3 times daily   clopidogrel (PLAVIX) 75 MG tablet Take 1 tablet (75 mg total) by mouth daily.   isosorbide mononitrate (IMDUR) 120 MG 24 hr tablet Take 1 tablet (120 mg total) by mouth daily.   losartan (COZAAR) 100 MG tablet Take 1 tablet (100 mg total) by mouth daily.   nitroGLYCERIN (NITROSTAT) 0.4 MG SL tablet Place 1 tablet (0.4 mg total) under the tongue as needed for chest pain.   ranolazine (RANEXA)  1000 MG SR tablet Take 1 tablet (1,000 mg total) by mouth 2 (two) times daily.   rosuvastatin (CRESTOR) 5 MG tablet Take 1 tablet (5 mg total) by mouth daily.     Allergies:   Atorvastatin and Hydrochlorothiazide w-triamterene   Social History   Socioeconomic History   Marital status: Married    Spouse name: Not on file   Number of children: Not on file   Years of education: Not on file   Highest education level: Not on file  Occupational History   Not on file  Tobacco Use   Smoking status: Former   Smokeless tobacco: Never  Vaping Use   Vaping Use: Never used  Substance and Sexual Activity   Alcohol use: Yes   Drug use: No   Sexual activity: Not on file   Other Topics Concern   Not on file  Social History Narrative   Not on file   Social Determinants of Health   Financial Resource Strain: Not on file  Food Insecurity: Not on file  Transportation Needs: Not on file  Physical Activity: Not on file  Stress: Not on file  Social Connections: Not on file     Family History: The patient's family history includes Cancer in her mother; Heart disease in her brother, brother, and father. ROS:   Please see the history of present illness.    All 14 point review of systems negative except as described per history of present illness  EKGs/Labs/Other Studies Reviewed:      Recent Labs: No results found for requested labs within last 365 days.  Recent Lipid Panel    Component Value Date/Time   CHOL 173 03/31/2021 0848   TRIG 72 03/31/2021 0848   HDL 64 03/31/2021 0848   CHOLHDL 2.7 03/31/2021 0848   CHOLHDL 4.6 02/02/2020 1010   VLDL 27 02/02/2020 1010   LDLCALC 95 03/31/2021 0848    Physical Exam:    VS:  BP 108/60 (BP Location: Left Arm, Patient Position: Sitting)   Pulse 79   Ht 5\' 1"  (1.549 m)   Wt 184 lb 3.2 oz (83.6 kg)   SpO2 95%   BMI 34.80 kg/m     Wt Readings from Last 3 Encounters:  09/13/22 184 lb 3.2 oz (83.6 kg)  07/16/21 156 lb 3.2 oz (70.9 kg)  03/31/21 152 lb 3.2 oz (69 kg)     GEN:  Well nourished, well developed in no acute distress HEENT: Normal NECK: No JVD; No carotid bruits LYMPHATICS: No lymphadenopathy CARDIAC: RRR, no murmurs, no rubs, no gallops RESPIRATORY:  Clear to auscultation without rales, wheezing or rhonchi  ABDOMEN: Soft, non-tender, non-distended MUSCULOSKELETAL:  No edema; No deformity  SKIN: Warm and dry LOWER EXTREMITIES: no swelling NEUROLOGIC:  Alert and oriented x 3 PSYCHIATRIC:  Normal affect   ASSESSMENT:    1. Coronary artery disease involving native coronary artery of native heart without angina pectoris   2. Pre-syncope   3. Dyslipidemia   4. Precordial pain     PLAN:    In order of problems listed above:  Coronary disease.  Status post PTCA and stenting of the circumflex in 2017, last cardiac catheterization showing small diagonal branch disease.  Too small to intervene.  Comes today to my office with chief complaint of chest pain.  I will give her nitroglycerin as needed, we will schedule him to have Lexiscan, will continue dual antiplatelet therapy until we have the situation clear. Presyncope denies having any she does  have implantable loop recorder did review interrogation no arrhythmia that could explain her syncope.  On top of that she denies having any symptoms lately. Dyslipidemia I reviewed her K PN which show me her LDL of 95 HDL 64 however this is from 2022 will get her fasting lipid profile. 4.  Dyspnea on exertion: Will schedule her to have echocardiogram to assess left ventricle ejection fraction   Medication Adjustments/Labs and Tests Ordered: Current medicines are reviewed at length with the patient today.  Concerns regarding medicines are outlined above.  No orders of the defined types were placed in this encounter.  Medication changes: No orders of the defined types were placed in this encounter.   Signed, Georgeanna Lea, MD, [facility] 09/13/2022 2:14 PM    [facility]

## 2022-09-13 NOTE — Addendum Note (Signed)
Addended by: Baldo Ash D on: 09/13/2022 02:36 PM   Modules accepted: Orders

## 2022-09-21 ENCOUNTER — Telehealth (HOSPITAL_COMMUNITY): Payer: Self-pay | Admitting: *Deleted

## 2022-09-21 NOTE — Telephone Encounter (Signed)
Patient given detailed instructions per Myocardial Perfusion Study Information Sheet for the test on 09/28/22 Patient notified to arrive 15 minutes early and that it is imperative to arrive on time for appointment to keep from having the test rescheduled.  If you need to cancel or reschedule your appointment, please call the office within 24 hours of your appointment. . Patient verbalized understanding.Ricky Ala, RN

## 2022-09-28 ENCOUNTER — Ambulatory Visit: Payer: No Typology Code available for payment source | Attending: Cardiology

## 2022-09-28 ENCOUNTER — Ambulatory Visit (INDEPENDENT_AMBULATORY_CARE_PROVIDER_SITE_OTHER): Payer: No Typology Code available for payment source

## 2022-09-28 DIAGNOSIS — R072 Precordial pain: Secondary | ICD-10-CM | POA: Diagnosis not present

## 2022-09-28 DIAGNOSIS — R0609 Other forms of dyspnea: Secondary | ICD-10-CM

## 2022-09-28 LAB — MYOCARDIAL PERFUSION IMAGING
Rest HR: 68 {beats}/min
Rest Nuclear Isotope Dose: 10.9 mCi
SDS: 2
SSS: 3
Stress Nuclear Isotope Dose: 28.6 mCi

## 2022-09-28 LAB — ECHOCARDIOGRAM COMPLETE: Weight: 2944 oz

## 2022-09-28 MED ORDER — TECHNETIUM TC 99M TETROFOSMIN IV KIT
28.6000 | PACK | Freq: Once | INTRAVENOUS | Status: AC | PRN
  Administered 2022-09-28: 28.6 via INTRAVENOUS

## 2022-09-28 MED ORDER — PERFLUTREN LIPID MICROSPHERE
1.0000 mL | INTRAVENOUS | Status: AC | PRN
Start: 2022-09-28 — End: 2022-09-28
  Administered 2022-09-28: 6 mL via INTRAVENOUS

## 2022-09-28 MED ORDER — TECHNETIUM TC 99M TETROFOSMIN IV KIT
10.9000 | PACK | Freq: Once | INTRAVENOUS | Status: AC | PRN
  Administered 2022-09-28: 10.9 via INTRAVENOUS

## 2022-09-28 MED ORDER — REGADENOSON 0.4 MG/5ML IV SOLN
0.4000 mg | Freq: Once | INTRAVENOUS | Status: AC
Start: 2022-09-28 — End: 2022-09-28
  Administered 2022-09-28: 0.4 mg via INTRAVENOUS

## 2022-09-29 LAB — MYOCARDIAL PERFUSION IMAGING
LV dias vol: 67 mL (ref 46–106)
LV sys vol: 19 mL
Nuc Stress EF: 72 %
Peak HR: 105 {beats}/min
SRS: 1
ST Depression (mm): 0 mm
TID: 0.99

## 2022-09-29 LAB — ECHOCARDIOGRAM COMPLETE
Area-P 1/2: 3.99 cm2
Height: 61 in
S' Lateral: 3 cm

## 2022-10-04 ENCOUNTER — Ambulatory Visit (INDEPENDENT_AMBULATORY_CARE_PROVIDER_SITE_OTHER): Payer: No Typology Code available for payment source

## 2022-10-04 DIAGNOSIS — R55 Syncope and collapse: Secondary | ICD-10-CM | POA: Diagnosis not present

## 2022-10-04 LAB — CUP PACEART REMOTE DEVICE CHECK
Date Time Interrogation Session: 20240517230504
Implantable Pulse Generator Implant Date: 20210921

## 2022-10-05 NOTE — Progress Notes (Signed)
[facility] Summary Report / Loop Recorder 

## 2022-10-06 ENCOUNTER — Telehealth: Payer: Self-pay

## 2022-10-06 ENCOUNTER — Telehealth: Payer: Self-pay | Admitting: Cardiology

## 2022-10-06 NOTE — Telephone Encounter (Signed)
Spoke with pt regarding Stress test results. Advised that Dr. Bing Matter would like to speak to her regarding the results. Sent to front desk to call for appt.

## 2022-10-06 NOTE — Telephone Encounter (Signed)
Patient called to follow-up on MYOCARDIAL PERFUSION test results.

## 2022-10-12 NOTE — Progress Notes (Unsigned)
Cardiology Office Note:    Date:  10/13/2022   ID:  Penny Boyer, DOB 01/05/1956, MRN [id number]  PCP:  [facility]   [facility] Providers Cardiologist:  Gypsy Balsam, MD Electrophysiologist:  Lanier Prude, MD     Referring MD: [facility]   CC: "feels terrible"  History of Present Illness:    Penny Boyer is a 67 y.o. female with a hx of CAD s/p PTCA and DES of LCx in 2017 [place], s/p loop recorder implantation 2021, dyslipidemia, hypertension, breast cancer, OSA.  In 2023 she was in [place], developed chest pain, taken to [facility] underwent cardiac catheterization which found she had 90% small diagonal branch disease, felt too small to intervene.  Most recently she was evaluated by Dr. Bing Matter on 09/13/2022, she endorsed some chest pain when she would get upset accompanied by shortness of breath, she endorsed a high level of stress.  Decision was made to proceed with DAPT, nitroglycerin, Lexi scan was arranged which revealed mild ischemia involving basal and midportion of the anterior wall, low risk study.  An echocardiogram was arranged on 09/29/2022 which revealed an EF 60 to 65%, mild LVH, grade 1 DD, trivial MR.  She presents today for follow-up, states she continues to feel terrible.  When asked to expound upon this she states she feels like she has no energy, is fatigued by the time she walks the short distance to her barn.  She states the symptoms have been going on since January, she feels there may be a correlation with weight gain, she has gained 35 pounds since September that she attributes to increased caloric intake and decreased physical activity.  Her recent stress test revealed mild ischemia however was a low risk study, repeat echocardiogram showed mild diastolic dysfunction. She denies chest pain, palpitations,  pnd, orthopnea, n, v, dizziness, syncope, edema, sudden weight gain, or early satiety.   Past Medical History:   Diagnosis Date   Abnormal nuclear stress test 12/20/2018   Chest pain 02/02/2020   Coronary artery disease involving native coronary artery has post PTCA and stenting of the circumflex artery in March 2017 of native heart 07/28/2016   Overview:  PTCA and stent of LCX artery in March 2017 in [place]   Dyslipidemia (high LDL; low HDL) 07/28/2016   Hyperlipidemia    Hypertension    Kidney stone 10/17/2019   Precordial pain 07/28/2016   Syncope     Past Surgical History:  Procedure Laterality Date   ABDOMINAL HYSTERECTOMY     APPENDECTOMY     CARDIAC CATHETERIZATION     CHOLECYSTECTOMY     CORONARY PRESSURE/FFR STUDY N/A 12/20/2018   Procedure: INTRAVASCULAR PRESSURE WIRE/FFR STUDY;  Surgeon: Marykay Lex, MD;  Location: [facility] INVASIVE CV LAB;  Service: Cardiovascular;  Laterality: N/A;   FOOT SURGERY     LEFT HEART CATH AND CORONARY ANGIOGRAPHY N/A 12/20/2018   Procedure: LEFT HEART CATH AND CORONARY ANGIOGRAPHY;  Surgeon: Marykay Lex, MD;  Location: [facility] INVASIVE CV LAB;  Service: Cardiovascular;  Laterality: N/A;   MANDIBLE FRACTURE SURGERY     Ulnar Surgery     WRIST FRACTURE SURGERY      Current Medications: Current Meds  Medication Sig   Alirocumab (PRALUENT) 150 MG/ML SOAJ Inject 1 mL (150 mg total) into the skin every 14 (fourteen) days.   aspirin EC 81 MG tablet Take 81 mg by mouth daily. Swallow whole.   busPIRone (BUSPAR) 10 MG tablet Take 5  mg by mouth 3 (three) times daily as needed (amxiety). Take 1/2 tablet 3 times daily   cetirizine (ZYRTEC) 10 MG tablet Take 10 mg by mouth daily.   Cholecalciferol 125 MCG (5000 UT) capsule Take 5,000 Units by mouth daily.   clopidogrel (PLAVIX) 75 MG tablet Take 1 tablet (75 mg total) by mouth daily.   isosorbide mononitrate (IMDUR) 120 MG 24 hr tablet Take 1 tablet (120 mg total) by mouth daily.   losartan (COZAAR) 100 MG tablet Take 1 tablet (100 mg total) by mouth daily.   losartan (COZAAR) 100 MG tablet Take 1 tablet (100 mg  total) by mouth daily.   nitroGLYCERIN (NITROSTAT) 0.4 MG SL tablet Place 1 tablet (0.4 mg total) under the tongue as needed for chest pain.   ranolazine (RANEXA) 1000 MG SR tablet Take 1 tablet (1,000 mg total) by mouth 2 (two) times daily.   rosuvastatin (CRESTOR) 5 MG tablet Take 1 tablet (5 mg total) by mouth daily.   Semaglutide-Weight Management 0.25 MG/0.5ML SOAJ Inject 0.25 mg into the skin once a week for 28 days.   [START ON 11/11/2022] Semaglutide-Weight Management 0.5 MG/0.5ML SOAJ Inject 0.5 mg into the skin once a week for 28 days.   [START ON 12/10/2022] Semaglutide-Weight Management 1 MG/0.5ML SOAJ Inject 1 mg into the skin once a week for 28 days.   [START ON 01/08/2023] Semaglutide-Weight Management 1.7 MG/0.75ML SOAJ Inject 1.7 mg into the skin once a week for 28 days.   [START ON 02/06/2023] Semaglutide-Weight Management 2.4 MG/0.75ML SOAJ Inject 2.4 mg into the skin once a week for 28 days.     Allergies:   Atorvastatin and Hydrochlorothiazide w-triamterene   Social History   Socioeconomic History   Marital status: Married    Spouse name: Not on file   Number of children: Not on file   Years of education: Not on file   Highest education level: Not on file  Occupational History   Not on file  Tobacco Use   Smoking status: Former   Smokeless tobacco: Never  Vaping Use   Vaping Use: Never used  Substance and Sexual Activity   Alcohol use: Yes   Drug use: No   Sexual activity: Not on file  Other Topics Concern   Not on file  Social History Narrative   Not on file   Social Determinants of Health   Financial Resource Strain: Not on file  Food Insecurity: Not on file  Transportation Needs: Not on file  Physical Activity: Not on file  Stress: Not on file  Social Connections: Not on file     Family History: The patient's family history includes Cancer in her mother; Heart disease in her brother, brother, and father.  ROS:   Please see the history of present  illness.     All other systems reviewed and are negative.  EKGs/Labs/Other Studies Reviewed:    The following studies were reviewed today: Cardiac Studies & Procedures   CARDIAC CATHETERIZATION  CARDIAC CATHETERIZATION 12/20/2018  Narrative  Mid LAD lesion is 60% stenosed - "ostial" lesion @ 2nd Diag  1st Diag lesion is 80% stenosed. Very small vessel - NOT PCI/PTCA target  Prox Cx to Mid Cx Stent is 5% stenosed.  Dist RCA lesion is 20% stenosed. RPDA lesion is 55% stenosed.  --------------------------------  The left ventricular systolic function is normal. The left ventricular ejection fraction is 55-65% by visual estimate.  LV end diastolic pressure is mildly elevated.  SUMMARY  Moderate two-vessel  disease with FFR Negative 60% lesion in LAD at D2, distal RCA-RPDA 55% with widely patent stent in proximal LCx.  Normal LVEF with mildly elevated LVEDP.  Likely false positive stress test   RECOMMENDATIONS  Discharge home after bedrest  Continue to optimize medical management.  Diagonal lesion could be possible source of angina, but not PCI target  Follow-up with Dr. Bing Matter.    Bryan Lemma, MD  Findings Coronary Findings Diagnostic  Dominance: Right  Left Main Vessel is small.  Left Anterior Descending Vessel is small. Mid LAD lesion is 60% stenosed. The lesion is distal to major branch and focal. Pressure wire/FFR was performed on the lesion. FFR: 0.89. FRACTIONAL FLOW RESERVE: 6 French XB 3.0 guide --&gt; Comet FFR wire zeroed and equalized in the left main, advance beyond the lesion.  DFR initially admit read as 0.92 therefore FFR performed.  However initial FFR prior to starting adenosine was 0.95.  Adenosine infused at 140 mcg/KG/min for 2:30 min --&gt; final FFR 0.89 = NOT PHYSIOLOGICALLY SIGNIFICANT  First Diagonal Branch Vessel is very small in size (&lt;2 mm) 1st Diag lesion is 80% stenosed. The lesion is discrete and concentric. Ostial Not  PCI/PTCA appropriate  First Septal Branch Vessel is small in size.  Second Diagonal Branch Vessel is moderate in size. Vessel is small to moderate in size (actually larger than the LAD)  Second Septal Branch Vessel is small in size.  Third Diagonal Branch Vessel is small in size.  Left Circumflex Vessel is moderate in size. The vessel exhibits minimal luminal irregularities. Prox Cx to Mid Cx lesion is 5% stenosed. The lesion was previously treated using a drug eluting stent over 2 years ago. Previously placed stent displays restenosis.  First Obtuse Marginal Branch Vessel is small in size.  Second Obtuse Marginal Branch Vessel is small in size.  Left Posterior Atrioventricular Artery Vessel is small in size.  Right Coronary Artery Dist RCA lesion is 20% stenosed. The lesion is concentric.  Acute Marginal Branch Vessel is small in size.  Right Posterior Descending Artery RPDA lesion is 55% stenosed. The lesion is segmental and smooth.  First Right Posterolateral Branch Vessel is moderate in size.  Second Right Posterolateral Branch Vessel is small in size.  Third Right Posterolateral Branch Vessel is small in size.  Intervention  No interventions have been documented.   STRESS TESTS  MYOCARDIAL PERFUSION IMAGING 09/29/2022  Narrative   Findings are consistent with mild ischemia involving basal and mid portion of the anterior wall. The study is low risk.   The study is intermediate risk.   No ST deviation was noted.   Left ventricular function is normal. Nuclear stress EF: 72 %. The left ventricular ejection fraction is hyperdynamic (>65%). End diastolic cavity size is normal.   Prior study available for comparison from 12/06/2018.   Abnormal radioisotope intake was noted within left breast and chect wall.   ECHOCARDIOGRAM  ECHOCARDIOGRAM COMPLETE 09/29/2022  Narrative ECHOCARDIOGRAM REPORT    Patient Name:   Penny Boyer Date of Exam:  09/28/2022 Medical Rec #:  [id number]          Height:       61.0 in Accession #:    [id number]         Weight:       184.0 lb Date of Birth:  July 14, 1955           BSA:          1.823 m Patient Age:  67 years           BP:           108/60 mmHg Patient Gender: F                  HR:           72 bpm. Exam Location:  [place]  Procedure: 2D Echo, Cardiac Doppler, Color Doppler and Intracardiac Opacification Agent  Indications:    Dyspnea on exertion [R06.09 (ICD-10-CM)]  History:        Patient has prior history of Echocardiogram examinations, most recent 08/19/2020. CAD, Signs/Symptoms:Dyspnea; Risk Factors:Dyslipidemia.  Sonographer:    Margreta Journey RDCS Referring Phys: [id number] ROBERT J KRASOWSKI  IMPRESSIONS   1. Left ventricular ejection fraction, by estimation, is 60 to 65%. The left ventricle has normal function. The left ventricle has no regional wall motion abnormalities. There is mild left ventricular hypertrophy. Left ventricular diastolic parameters are consistent with Grade I diastolic dysfunction (impaired relaxation). 2. Right ventricular systolic function is normal. The right ventricular size is normal. 3. The mitral valve is normal in structure. Trivial mitral valve regurgitation. No evidence of mitral stenosis. 4. The aortic valve is normal in structure. Aortic valve regurgitation is not visualized. No aortic stenosis is present. 5. The inferior vena cava is normal in size with greater than 50% respiratory variability, suggesting right atrial pressure of 3 mmHg.  FINDINGS Left Ventricle: Left ventricular ejection fraction, by estimation, is 60 to 65%. The left ventricle has normal function. The left ventricle has no regional wall motion abnormalities. Definity contrast agent was given IV to delineate the left ventricular endocardial borders. The left ventricular internal cavity size was normal in size. There is mild left ventricular hypertrophy. Left ventricular  diastolic parameters are consistent with Grade I diastolic dysfunction (impaired relaxation).  Right Ventricle: The right ventricular size is normal. No increase in right ventricular wall thickness. Right ventricular systolic function is normal.  Left Atrium: Left atrial size was normal in size.  Right Atrium: Right atrial size was normal in size.  Pericardium: There is no evidence of pericardial effusion.  Mitral Valve: The mitral valve is normal in structure. Trivial mitral valve regurgitation. No evidence of mitral valve stenosis.  Tricuspid Valve: The tricuspid valve is normal in structure. Tricuspid valve regurgitation is not demonstrated. No evidence of tricuspid stenosis.  Aortic Valve: The aortic valve is normal in structure. Aortic valve regurgitation is not visualized. No aortic stenosis is present.  Pulmonic Valve: The pulmonic valve was normal in structure. Pulmonic valve regurgitation is not visualized. No evidence of pulmonic stenosis.  Aorta: The aortic root is normal in size and structure.  Venous: The inferior vena cava is normal in size with greater than 50% respiratory variability, suggesting right atrial pressure of 3 mmHg.  IAS/Shunts: No atrial level shunt detected by color flow Doppler.   LEFT VENTRICLE PLAX 2D LVIDd:         4.40 cm Diastology LVIDs:         3.00 cm LV e' medial:    11.00 cm/s LV PW:         1.10 cm LV E/e' medial:  5.2 LV IVS:        1.40 cm LV e' lateral:   9.32 cm/s LV E/e' lateral: 6.1   RIGHT VENTRICLE             IVC RV Basal diam:  3.00 cm     IVC diam: 2.00 cm RV  Mid diam:    2.00 cm RV S prime:     11.50 cm/s TAPSE (M-mode): 2.7 cm  LEFT ATRIUM             Index        RIGHT ATRIUM           Index LA diam:        3.90 cm 2.14 cm/m   RA Area:     13.10 cm LA Vol (A2C):   62.8 ml 34.45 ml/m  RA Volume:   26.00 ml  14.26 ml/m LA Vol (A4C):   39.5 ml 21.67 ml/m LA Biplane Vol: 50.9 ml 27.92 ml/m AORTIC VALVE LVOT Vmax:    98.45 cm/s LVOT Vmean:  64.700 cm/s LVOT VTI:    0.210 m  AORTA Ao Root diam: 3.60 cm Ao Asc diam:  3.00 cm Ao Desc diam: 1.90 cm  MITRAL VALVE MV Area (PHT): 3.99 cm    SHUNTS MV Decel Time: 190 msec    Systemic VTI: 0.21 m MV E velocity: 57.10 cm/s MV A velocity: 78.10 cm/s MV E/A ratio:  0.73  Gypsy Balsam MD Electronically signed by Gypsy Balsam MD Signature Date/Time: 09/29/2022/8:39:25 AM    Final    MONITORS  LONG TERM MONITOR-LIVE TELEMETRY (3-14 DAYS) 02/23/2020  Narrative Penny Boyer, DOB 1956-02-23, MRN [id number]  HOLTER MONITOR REPORT:    Date of test:                 01/29/2020 Duration of test:           5 days Indication:                    Syncope Ordering physician:  Georgeanna Lea, MD Referring physician:  Georgeanna Lea, MD   Baseline rhythm: Sinus  Minimum heart rate: 59 BPM.  Average heart rate: 85 BPM.  Maximal heart rate 149 BPM.  Atrial arrhythmia: Rare PACs noted, no sustained arrhythmia  Ventricular arrhythmia: Rare PVC noted, no sustained arrhythmia, there were episodes trigeminy  Conduction abnormality: None  Symptoms: 1 triggered event showing sinus rhythm   Conclusion: Normal monitor  Interpreting  cardiologist: Gypsy Balsam, MD Date: 02/23/2020 11:36 AM           EKG:  EKG is  ordered today.  The ekg ordered today demonstrates normal sinus rhythm with nonspecific T wave abnormalities, heart rate 84 bpm, consistent with prior EKG tracings.  Recent Labs: No results found for requested labs within last 365 days.  Recent Lipid Panel    Component Value Date/Time   CHOL 173 03/31/2021 0848   TRIG 72 03/31/2021 0848   HDL 64 03/31/2021 0848   CHOLHDL 2.7 03/31/2021 0848   CHOLHDL 4.6 02/02/2020 1010   VLDL 27 02/02/2020 1010   LDLCALC 95 03/31/2021 0848     Risk Assessment/Calculations:                Physical Exam:    VS:  BP 120/88   Pulse 84   Ht 5\' 1"  (1.549 m)   Wt 183 lb (83  kg)   SpO2 92%   BMI 34.58 kg/m     Wt Readings from Last 3 Encounters:  10/13/22 183 lb (83 kg)  09/28/22 184 lb (83.5 kg)  09/13/22 184 lb 3.2 oz (83.6 kg)     GEN:  Well nourished, well developed in no acute distress HEENT: Normal NECK: No JVD; No carotid bruits LYMPHATICS: No lymphadenopathy CARDIAC:  RRR, no murmurs, rubs, gallops RESPIRATORY:  Clear to auscultation without rales, wheezing or rhonchi  ABDOMEN: Soft, non-tender, non-distended MUSCULOSKELETAL:  No edema; No deformity  SKIN: Warm and dry NEUROLOGIC:  Alert and oriented x 3 PSYCHIATRIC:  Normal affect   ASSESSMENT:    1. Atherosclerosis of native coronary artery of native heart without angina pectoris   2. Dyslipidemia   3. Dyspnea on exertion   4. Obstructive sleep apnea   5. Essential hypertension   6. Obesity (BMI 30.0-34.9)    PLAN:    In order of problems listed above:  Atherosclerosis of native coronary artery of native heart without angina pectoris-s/p PTCA with DES to left circumflex in 2017, 2023 most recently LHC [facility] reportedly revealed 90% stenosed first diagonal branch too small to intervene upon.  Recently had lexiscan for ongoing fatigue which was low risk but abnormal. She is unable to tolerate beta-blocker secondary to syncope/dizziness/AV conduction pauses. Continue aspirin 81 mg daily, continue Plavix 75 mg daily, continue isosorbide 120 mg daily, continue Ranexa 1000 mg twice daily, continue Crestor 5 mg daily. Discussed with Dr. Dulce Sellar abnormal stress test, decided we will refer her to cardiac rehab.  Will start her on Wegovy to further reduce her CVD risk.  Obesity-BMI is 20, she has gained 35 pounds since September and has had great difficulty getting the weight back off.  Will start her on Wegovy to help augment her weight loss as well as assist with CVD protection. Fatigue-this has been bothersome for her for the last 5 to 6 months, she attributes this to weight gain.  Blood  work was recently checked at the [facility] and was unrevealing, she states her vitamin D level was low and supplementation was started for this.  Will check her TSH and her vitamin B12.  Will refer her to cardiac rehab. Dyslipidemia-was most recently checked at the [place] a few weeks ago, records are not available for review however she indicated that her cholesterol was up.  Continue Crestor 5 mg daily. Hypertension-blood pressure is 120/88 today, continue losartan 100 mg daily. OSA-states she was diagnosed with this in the past however due to claustrophobia she is not able to tolerate any assistive devices, she does not want to investigate this any further at this time.   Disposition - TSH, Vit B12, start Wegovy per above, refer to cardiac rehab.           Medication Adjustments/Labs and Tests Ordered: Current medicines are reviewed at length with the patient today.  Concerns regarding medicines are outlined above.  Orders Placed This Encounter  Procedures   B12 and Folate Panel   TSH   AMB referral to cardiac rehabilitation   EKG 12-Lead   Meds ordered this encounter  Medications   Alirocumab (PRALUENT) 150 MG/ML SOAJ    Sig: Inject 1 mL (150 mg total) into the skin every 14 (fourteen) days.    Dispense:  2 mL    Refill:  10   nitroGLYCERIN (NITROSTAT) 0.4 MG SL tablet    Sig: Place 1 tablet (0.4 mg total) under the tongue as needed for chest pain.    Dispense:  25 tablet    Refill:  5   Semaglutide-Weight Management 0.25 MG/0.5ML SOAJ    Sig: Inject 0.25 mg into the skin once a week for 28 days.    Dispense:  2 mL    Refill:  0    Order Specific Question:   Supervising Provider  Answer:   Georgeanna Lea [[id number]]   Semaglutide-Weight Management 0.5 MG/0.5ML SOAJ    Sig: Inject 0.5 mg into the skin once a week for 28 days.    Dispense:  2 mL    Refill:  0    Order Specific Question:   Supervising Provider    Answer:   Georgeanna Lea [[id number]]   Semaglutide-Weight  Management 1 MG/0.5ML SOAJ    Sig: Inject 1 mg into the skin once a week for 28 days.    Dispense:  2 mL    Refill:  0    Order Specific Question:   Supervising Provider    Answer:   Georgeanna Lea [[id number]]   Semaglutide-Weight Management 1.7 MG/0.75ML SOAJ    Sig: Inject 1.7 mg into the skin once a week for 28 days.    Dispense:  3 mL    Refill:  0    Order Specific Question:   Supervising Provider    Answer:   Georgeanna Lea [[id number]]   Semaglutide-Weight Management 2.4 MG/0.75ML SOAJ    Sig: Inject 2.4 mg into the skin once a week for 28 days.    Dispense:  3 mL    Refill:  0    Order Specific Question:   Supervising Provider    Answer:   Georgeanna Lea [[id number]]    Patient Instructions  Medication Instructions:  You have been prescribed a GLP-1 today called Wegovy.  This is a once weekly injection that helps you lose weight and maintain weight loss.  This medication works best when combined with healthy diet and regular exercise.  GLP1 medications help you lose weight by reducing appetite and helping you feel fuller longer. Most common side effects include nausea, vomiting, diarrhea. These side effects can be limited by eating slowly and eating small meals and often improve after a few days. We have Sent a prescription and are awaiting prior authorization from insurance.   *If you need a refill on your cardiac medications before your next appointment, please call your pharmacy*   Lab Work: Your physician recommends that you return for lab work in: Today for a Vit B12 and TSH  If you have labs (blood work) drawn today and your tests are completely normal, you will receive your results only by: MyChart Message (if you have MyChart) OR A paper copy in the mail If you have any lab test that is abnormal or we need to change your treatment, we will call you to review the results.   Testing/Procedures: NONE   Follow-Up: At [facility], you and your health  needs are our priority.  As part of our continuing mission to provide you with exceptional heart care, we have created designated Provider Care Teams.  These Care Teams include your primary Cardiologist (physician) and Advanced Practice Providers (APPs -  Physician Assistants and Nurse Practitioners) who all work together to provide you with the care you need, when you need it.  We recommend signing up for the patient portal called "MyChart".  Sign up information is provided on this After Visit Summary.  MyChart is used to connect with patients for Virtual Visits (Telemedicine).  Patients are able to view lab/test results, encounter notes, upcoming appointments, etc.  Non-urgent messages can be sent to your provider as well.   To learn more about what you can do with MyChart, go to [email or web address].    Your next appointment:   3 month(s)  Provider:   Molly Maduro  Bing Matter, MD    Other Instructions     Signed, Flossie Dibble, NP  10/13/2022 10:28 AM    [facility]

## 2022-10-13 ENCOUNTER — Encounter: Payer: Self-pay | Admitting: Cardiology

## 2022-10-13 ENCOUNTER — Ambulatory Visit: Payer: No Typology Code available for payment source | Attending: Cardiology | Admitting: Cardiology

## 2022-10-13 VITALS — BP 120/88 | HR 84 | Ht 61.0 in | Wt 183.0 lb

## 2022-10-13 DIAGNOSIS — I10 Essential (primary) hypertension: Secondary | ICD-10-CM

## 2022-10-13 DIAGNOSIS — G4733 Obstructive sleep apnea (adult) (pediatric): Secondary | ICD-10-CM | POA: Diagnosis not present

## 2022-10-13 DIAGNOSIS — E66811 Obesity, class 1: Secondary | ICD-10-CM

## 2022-10-13 DIAGNOSIS — E669 Obesity, unspecified: Secondary | ICD-10-CM

## 2022-10-13 DIAGNOSIS — R5383 Other fatigue: Secondary | ICD-10-CM

## 2022-10-13 DIAGNOSIS — R0609 Other forms of dyspnea: Secondary | ICD-10-CM | POA: Diagnosis not present

## 2022-10-13 DIAGNOSIS — I2510 Atherosclerotic heart disease of native coronary artery without angina pectoris: Secondary | ICD-10-CM

## 2022-10-13 DIAGNOSIS — E785 Hyperlipidemia, unspecified: Secondary | ICD-10-CM | POA: Diagnosis not present

## 2022-10-13 MED ORDER — SEMAGLUTIDE-WEIGHT MANAGEMENT 2.4 MG/0.75ML ~~LOC~~ SOAJ: 2.4000 mg | SUBCUTANEOUS | 0 refills | Status: AC

## 2022-10-13 MED ORDER — SEMAGLUTIDE-WEIGHT MANAGEMENT 0.5 MG/0.5ML ~~LOC~~ SOAJ: 0.5000 mg | SUBCUTANEOUS | 0 refills | Status: AC

## 2022-10-13 MED ORDER — SEMAGLUTIDE-WEIGHT MANAGEMENT 0.25 MG/0.5ML ~~LOC~~ SOAJ: 0.2500 mg | SUBCUTANEOUS | 0 refills | Status: AC

## 2022-10-13 MED ORDER — PRALUENT 150 MG/ML ~~LOC~~ SOAJ: 150.0000 mg | SUBCUTANEOUS | 10 refills | Status: DC

## 2022-10-13 MED ORDER — SEMAGLUTIDE-WEIGHT MANAGEMENT 1 MG/0.5ML ~~LOC~~ SOAJ: 1.0000 mg | SUBCUTANEOUS | 0 refills | Status: AC

## 2022-10-13 MED ORDER — NITROGLYCERIN 0.4 MG SL SUBL: 0.4000 mg | SUBLINGUAL_TABLET | SUBLINGUAL | 5 refills | Status: DC | PRN

## 2022-10-13 MED ORDER — SEMAGLUTIDE-WEIGHT MANAGEMENT 1.7 MG/0.75ML ~~LOC~~ SOAJ: 1.7000 mg | SUBCUTANEOUS | 0 refills | Status: AC

## 2022-10-13 NOTE — Patient Instructions (Signed)
Medication Instructions:  You have been prescribed a GLP-1 today called Wegovy.  This is a once weekly injection that helps you lose weight and maintain weight loss.  This medication works best when combined with healthy diet and regular exercise.  GLP1 medications help you lose weight by reducing appetite and helping you feel fuller longer. Most common side effects include nausea, vomiting, diarrhea. These side effects can be limited by eating slowly and eating small meals and often improve after a few days. We have Sent a prescription and are awaiting prior authorization from insurance.   *If you need a refill on your cardiac medications before your next appointment, please call your pharmacy*   Lab Work: Your physician recommends that you return for lab work in: Today for a Vit B12 and TSH  If you have labs (blood work) drawn today and your tests are completely normal, you will receive your results only by: MyChart Message (if you have MyChart) OR A paper copy in the mail If you have any lab test that is abnormal or we need to change your treatment, we will call you to review the results.   Testing/Procedures: NONE   Follow-Up: At [facility], you and your health needs are our priority.  As part of our continuing mission to provide you with exceptional heart care, we have created designated Provider Care Teams.  These Care Teams include your primary Cardiologist (physician) and Advanced Practice Providers (APPs -  Physician Assistants and Nurse Practitioners) who all work together to provide you with the care you need, when you need it.  We recommend signing up for the patient portal called "MyChart".  Sign up information is provided on this After Visit Summary.  MyChart is used to connect with patients for Virtual Visits (Telemedicine).  Patients are able to view lab/test results, encounter notes, upcoming appointments, etc.  Non-urgent messages can be sent to your provider as  well.   To learn more about what you can do with MyChart, go to [email or web address].    Your next appointment:   3 month(s)  Provider:   Gypsy Balsam, MD    Other Instructions

## 2022-10-14 ENCOUNTER — Telehealth: Payer: Self-pay

## 2022-10-14 LAB — B12 AND FOLATE PANEL
Folate: 4.5 ng/mL
Vitamin B-12: 767 pg/mL (ref 232–1245)

## 2022-10-14 LAB — TSH: TSH: 1.23 u[IU]/mL (ref 0.450–4.500)

## 2022-10-14 NOTE — Telephone Encounter (Signed)
-----   Message from Flossie Dibble, NP sent at 10/14/2022  7:38 AM EDT ----- Penny Boyer, Normal thyroid and normal B12 level. I did hear back from the [place], you must be enrolled in a program that helps you learn how to eat better/be healthy. I am going to refer you to [facility], then I can check that box for the [place] and hopefully they will cover the [facility].  Best, Victorino Dike

## 2022-10-14 NOTE — Addendum Note (Signed)
Addended by: Flossie Dibble on: 10/14/2022 02:08 PM   Modules accepted: Orders

## 2022-10-14 NOTE — Telephone Encounter (Signed)
Patient notified of results and plan through My chart. Awaiting for a response on how to refer the patient for health and wellness through the [place].

## 2022-10-21 ENCOUNTER — Telehealth: Payer: Self-pay | Admitting: Cardiology

## 2022-10-21 NOTE — Telephone Encounter (Signed)
[facility] calling, he said, [facility] and [facility] should be sent to [facility] to cover pt's rehab

## 2022-11-02 NOTE — Progress Notes (Signed)
[facility] Summary Report / Loop Recorder 

## 2022-11-04 ENCOUNTER — Ambulatory Visit (INDEPENDENT_AMBULATORY_CARE_PROVIDER_SITE_OTHER): Payer: No Typology Code available for payment source

## 2022-11-04 DIAGNOSIS — R55 Syncope and collapse: Secondary | ICD-10-CM

## 2022-11-04 LAB — CUP PACEART REMOTE DEVICE CHECK
Date Time Interrogation Session: 20240619230740
Implantable Pulse Generator Implant Date: 20210921

## 2022-11-24 NOTE — Progress Notes (Signed)
[facility] Summary Report / Loop Recorder 

## 2022-12-07 ENCOUNTER — Ambulatory Visit: Payer: No Typology Code available for payment source

## 2022-12-07 DIAGNOSIS — R55 Syncope and collapse: Secondary | ICD-10-CM | POA: Diagnosis not present

## 2022-12-09 LAB — CUP PACEART REMOTE DEVICE CHECK
Date Time Interrogation Session: 20240722230430
Implantable Pulse Generator Implant Date: 20210921

## 2022-12-14 ENCOUNTER — Ambulatory Visit: Payer: No Typology Code available for payment source | Attending: Cardiology | Admitting: Cardiology

## 2022-12-14 ENCOUNTER — Encounter: Payer: Self-pay | Admitting: Cardiology

## 2022-12-14 VITALS — BP 130/90 | HR 77 | Ht 64.0 in | Wt 179.0 lb

## 2022-12-14 DIAGNOSIS — I25118 Atherosclerotic heart disease of native coronary artery with other forms of angina pectoris: Secondary | ICD-10-CM | POA: Diagnosis not present

## 2022-12-14 DIAGNOSIS — E785 Hyperlipidemia, unspecified: Secondary | ICD-10-CM | POA: Diagnosis not present

## 2022-12-14 DIAGNOSIS — G4733 Obstructive sleep apnea (adult) (pediatric): Secondary | ICD-10-CM

## 2022-12-14 DIAGNOSIS — R55 Syncope and collapse: Secondary | ICD-10-CM

## 2022-12-14 DIAGNOSIS — I10 Essential (primary) hypertension: Secondary | ICD-10-CM

## 2022-12-14 MED ORDER — CLOPIDOGREL BISULFATE 75 MG PO TABS: 75.0000 mg | ORAL_TABLET | Freq: Every day | ORAL | 3 refills | Status: DC

## 2022-12-14 MED ORDER — NITROGLYCERIN 0.4 MG SL SUBL: 0.4000 mg | SUBLINGUAL_TABLET | SUBLINGUAL | 5 refills | Status: DC | PRN

## 2022-12-14 MED ORDER — ROSUVASTATIN CALCIUM 5 MG PO TABS: 5.0000 mg | ORAL_TABLET | Freq: Every day | ORAL | 3 refills | Status: DC

## 2022-12-14 MED ORDER — LOSARTAN POTASSIUM 100 MG PO TABS: 100.0000 mg | ORAL_TABLET | Freq: Every day | ORAL | 3 refills | Status: DC

## 2022-12-14 MED ORDER — ISOSORBIDE MONONITRATE ER 120 MG PO TB24: 120.0000 mg | ORAL_TABLET | Freq: Every day | ORAL | 3 refills | Status: DC

## 2022-12-14 MED ORDER — RANOLAZINE ER 1000 MG PO TB12: 1000.0000 mg | ORAL_TABLET | Freq: Two times a day (BID) | ORAL | 3 refills | Status: DC

## 2022-12-14 NOTE — Addendum Note (Signed)
Addended by: Baldo Ash D on: 12/14/2022 01:29 PM   Modules accepted: Orders

## 2022-12-14 NOTE — Patient Instructions (Signed)

## 2022-12-14 NOTE — Progress Notes (Signed)
Cardiology Office Note:    Date:  12/14/2022   ID:  Penny Boyer, DOB 09-Jun-1955, MRN [id number]  [facility]  Cardiologist:  Gypsy Balsam, MD    Referring MD: [facility]   Chief Complaint  Patient presents with   Follow-up    History of Present Illness:    Penny Boyer is a 67 y.o. female  with past medical history significant for coronary artery disease. In 2017 she did have PTCA and stenting of the circumflex artery she also have history of dyslipidemia, obesity however he lost significant amount of weight being active at the gym as well as proper diet, obstructive sleep apnea, breast CA. At the end of last year she end up being a [facility] start [place] chest discomfort [place] airport was taken to [facility] cardiac catheterization was performed she was find to have 90% small diagonal branch disease. It was felt to be too small to intervene she was discharged home with medications.  Comes to my office today for follow-up.  Overall she is doing fine last time she was complaining of being weak tired and exhausted but now she started taking Wegovy, she also signed up for cardiac rehab and she is anxiously waiting started.  Denies have any chest pain tightness squeezing pressure burning chest still overall she said this is not like she used to be.  Past Medical History:  Diagnosis Date   Abnormal nuclear stress test 12/20/2018   Chest pain 02/02/2020   Coronary artery disease involving native coronary artery has post PTCA and stenting of the circumflex artery in March 2017 of native heart 07/28/2016   Overview:  PTCA and stent of LCX artery in March 2017 in [place]   Dyslipidemia (high LDL; low HDL) 07/28/2016   Hyperlipidemia    Hypertension    Kidney stone 10/17/2019   Precordial pain 07/28/2016   Syncope     Past Surgical History:  Procedure Laterality Date   ABDOMINAL HYSTERECTOMY     APPENDECTOMY     CARDIAC CATHETERIZATION     CHOLECYSTECTOMY      CORONARY PRESSURE/FFR STUDY N/A 12/20/2018   Procedure: INTRAVASCULAR PRESSURE WIRE/FFR STUDY;  Surgeon: Marykay Lex, MD;  Location: [facility] INVASIVE CV LAB;  Service: Cardiovascular;  Laterality: N/A;   FOOT SURGERY     LEFT HEART CATH AND CORONARY ANGIOGRAPHY N/A 12/20/2018   Procedure: LEFT HEART CATH AND CORONARY ANGIOGRAPHY;  Surgeon: Marykay Lex, MD;  Location[facility] INVASIVE CV LAB;  Service: Cardiovascular;  Laterality: N/A;   MANDIBLE FRACTURE SURGERY     Ulnar Surgery     WRIST FRACTURE SURGERY      Current Medications: Current Meds  Medication Sig   Alirocumab (PRALUENT) 150 MG/ML SOAJ Inject 1 mL (150 mg total) into the skin every 14 (fourteen) days.   aspirin EC 81 MG tablet Take 81 mg by mouth daily. Swallow whole.   busPIRone (BUSPAR) 10 MG tablet Take 5 mg by mouth 3 (three) times daily as needed (amxiety). Take 1/2 tablet 3 times daily   cetirizine (ZYRTEC) 10 MG tablet Take 10 mg by mouth daily.   Cholecalciferol 125 MCG (5000 UT) capsule Take 5,000 Units by mouth daily.   clopidogrel (PLAVIX) 75 MG tablet Take 1 tablet (75 mg total) by mouth daily.   isosorbide mononitrate (IMDUR) 120 MG 24 hr tablet Take 1 tablet (120 mg total) by mouth daily.   losartan (COZAAR) 100 MG tablet Take 1 tablet (100  mg total) by mouth daily.   nitroGLYCERIN (NITROSTAT) 0.4 MG SL tablet Place 1 tablet (0.4 mg total) under the tongue as needed for chest pain.   ranolazine (RANEXA) 1000 MG SR tablet Take 1 tablet (1,000 mg total) by mouth 2 (two) times daily.   rosuvastatin (CRESTOR) 5 MG tablet Take 1 tablet (5 mg total) by mouth daily.   Semaglutide-Weight Management 1 MG/0.5ML SOAJ Inject 1 mg into the skin once a week for 28 days.   [START ON 01/08/2023] Semaglutide-Weight Management 1.7 MG/0.75ML SOAJ Inject 1.7 mg into the skin once a week for 28 days.   [START ON 02/06/2023] Semaglutide-Weight Management 2.4 MG/0.75ML SOAJ Inject 2.4 mg into the skin once a week for 28 days.    [DISCONTINUED] losartan (COZAAR) 100 MG tablet Take 1 tablet (100 mg total) by mouth daily.     Allergies:   Atorvastatin and Hydrochlorothiazide w-triamterene   Social History   Socioeconomic History   Marital status: Married    Spouse name: Not on file   Number of children: Not on file   Years of education: Not on file   Highest education level: Not on file  Occupational History   Not on file  Tobacco Use   Smoking status: Former   Smokeless tobacco: Never  Vaping Use   Vaping status: Never Used  Substance and Sexual Activity   Alcohol use: Yes   Drug use: No   Sexual activity: Not on file  Other Topics Concern   Not on file  Social History Narrative   Not on file   Social Determinants of Health   Financial Resource Strain: Not on file  Food Insecurity: Not on file  Transportation Needs: Not on file  Physical Activity: Not on file  Stress: No Stress Concern Present (06/18/2022)   Received from [facility], [facility]   Stress    Feeling of Stress : Not on file  Social Connections: Not on file     Family History: The patient's family history includes Cancer in her mother; Heart disease in her brother, brother, and father. ROS:   Please see the history of present illness.    All 14 point review of systems negative except as described per history of present illness  EKGs/Labs/Other Studies Reviewed:         Recent Labs: 10/13/2022: TSH 1.230  Recent Lipid Panel    Component Value Date/Time   CHOL 173 03/31/2021 0848   TRIG 72 03/31/2021 0848   HDL 64 03/31/2021 0848   CHOLHDL 2.7 03/31/2021 0848   CHOLHDL 4.6 02/02/2020 1010   VLDL 27 02/02/2020 1010   LDLCALC 95 03/31/2021 0848    Physical Exam:    VS:  BP (!) 130/90 (BP Location: Left Arm, Patient Position: Sitting)   Pulse 77   Ht 5\' 4"  (1.626 m)   Wt 179 lb (81.2 kg)   SpO2 97%   BMI 30.73 kg/m     Wt Readings from Last 3 Encounters:  12/14/22 179 lb (81.2 kg)  10/13/22 183  lb (83 kg)  09/28/22 184 lb (83.5 kg)     GEN:  Well nourished, well developed in no acute distress HEENT: Normal NECK: No JVD; No carotid bruits LYMPHATICS: No lymphadenopathy CARDIAC: RRR, no murmurs, no rubs, no gallops RESPIRATORY:  Clear to auscultation without rales, wheezing or rhonchi  ABDOMEN: Soft, non-tender, non-distended MUSCULOSKELETAL:  No edema; No deformity  SKIN: Warm and dry LOWER EXTREMITIES: no swelling NEUROLOGIC:  Alert  and oriented x 3 PSYCHIATRIC:  Normal affect   ASSESSMENT:    1. Coronary artery disease of native artery of native heart with stable angina pectoris (HCC)   2. Dyslipidemia   3. Morbid obesity (HCC)   4. Obstructive sleep apnea   5. Essential (primary) hypertension   6. Pre-syncope    PLAN:    In order of problems listed above:  Coronary disease: Stable from that point review she does have mildly abnormal stress does not have typical symptoms.  Overall complaint is just general weakness.  I hope that will improve with [facility] as well as exercises in cardiac rehab and some weight loss that anticipate to happen. Dyslipidemia: I do not see any recent fasting lipid profile.  She is on moderate intensity statin will check her fasting lipid profile. Morbid obesity she is starting [facility] that should help. Essential hypertension blood pressure well-controlled continue present management   Medication Adjustments/Labs and Tests Ordered: Current medicines are reviewed at length with the patient today.  Concerns regarding medicines are outlined above.  No orders of the defined types were placed in this encounter.  Medication changes: No orders of the defined types were placed in this encounter.   Signed, Georgeanna Lea, MD, [facility] 12/14/2022 1:20 PM    [facility]

## 2022-12-23 NOTE — Progress Notes (Signed)
[facility] Summary Report / Loop Recorder 

## 2023-01-09 LAB — CUP PACEART REMOTE DEVICE CHECK
Date Time Interrogation Session: 20240824230629
Implantable Pulse Generator Implant Date: 20210921

## 2023-01-10 ENCOUNTER — Ambulatory Visit (INDEPENDENT_AMBULATORY_CARE_PROVIDER_SITE_OTHER): Payer: No Typology Code available for payment source

## 2023-01-10 DIAGNOSIS — R55 Syncope and collapse: Secondary | ICD-10-CM

## 2023-01-16 DIAGNOSIS — R55 Syncope and collapse: Secondary | ICD-10-CM | POA: Diagnosis not present

## 2023-01-19 NOTE — Progress Notes (Signed)
[facility] Summary Report / Loop Recorder 

## 2023-02-11 LAB — CUP PACEART REMOTE DEVICE CHECK
Date Time Interrogation Session: 20240926230707
Implantable Pulse Generator Implant Date: 20210921

## 2023-02-14 ENCOUNTER — Ambulatory Visit (INDEPENDENT_AMBULATORY_CARE_PROVIDER_SITE_OTHER): Payer: No Typology Code available for payment source

## 2023-02-14 DIAGNOSIS — R55 Syncope and collapse: Secondary | ICD-10-CM

## 2023-03-01 NOTE — Progress Notes (Signed)
[facility] Summary Report / Loop Recorder 

## 2023-03-18 LAB — CUP PACEART REMOTE DEVICE CHECK
Date Time Interrogation Session: 20241029230135
Implantable Pulse Generator Implant Date: 20210921

## 2023-03-21 ENCOUNTER — Ambulatory Visit (INDEPENDENT_AMBULATORY_CARE_PROVIDER_SITE_OTHER): Payer: No Typology Code available for payment source

## 2023-03-21 DIAGNOSIS — R55 Syncope and collapse: Secondary | ICD-10-CM | POA: Diagnosis not present

## 2023-03-28 ENCOUNTER — Ambulatory Visit: Payer: No Typology Code available for payment source | Attending: Cardiology | Admitting: Cardiology

## 2023-04-18 NOTE — Progress Notes (Signed)
[facility] Summary Report / Loop Recorder 

## 2023-04-21 ENCOUNTER — Telehealth: Payer: Self-pay | Admitting: Cardiology

## 2023-04-21 ENCOUNTER — Other Ambulatory Visit: Payer: Self-pay

## 2023-04-21 MED ORDER — LOSARTAN POTASSIUM 100 MG PO TABS: 100.0000 mg | ORAL_TABLET | Freq: Every day | ORAL | 3 refills | Status: DC

## 2023-04-21 MED ORDER — NITROGLYCERIN 0.4 MG SL SUBL: 0.4000 mg | SUBLINGUAL_TABLET | SUBLINGUAL | 5 refills | Status: DC | PRN

## 2023-04-21 MED ORDER — ROSUVASTATIN CALCIUM 5 MG PO TABS: 5.0000 mg | ORAL_TABLET | Freq: Every day | ORAL | 3 refills | Status: DC

## 2023-04-21 NOTE — Telephone Encounter (Signed)
Refilss for 90 day 3 ref sent for Losartan, Nitroglycerin and Rosuvastatin. All others have refills. Sent to [facility] per pt request

## 2023-04-21 NOTE — Telephone Encounter (Signed)
*  STAT* If patient is at the pharmacy, call can be transferred to refill team.   1. Which medications need to be refilled? (please list name of each medication and dose if known)  Alirocumab (PRALUENT) 150 MG/ML SOAJ  busPIRone (BUSPAR) 10 MG tablet   clopidogrel (PLAVIX) 75 MG tablet  isosorbide mononitrate (IMDUR) 120 MG 24 hr tablet   losartan (COZAAR) 100 MG tablet (Expired)    nitroGLYCERIN (NITROSTAT) 0.4 MG SL tablet  ranolazine (RANEXA) 1000 MG SR tablet  rosuvastatin (CRESTOR) 5 MG tablet (Expired)  2. Which pharmacy/location (including street and city if local pharmacy) is medication to be sent to? [facility] - [place], [place] - [address] Phone: [phone]  Fax: [phone]   3. Do they need a 30 day or 90 day supply? 90

## 2023-04-22 ENCOUNTER — Ambulatory Visit: Payer: No Typology Code available for payment source | Attending: Cardiology | Admitting: Cardiology

## 2023-04-22 ENCOUNTER — Encounter: Payer: Self-pay | Admitting: Cardiology

## 2023-04-22 VITALS — BP 160/110 | HR 84 | Ht 61.0 in | Wt 177.0 lb

## 2023-04-22 DIAGNOSIS — E785 Hyperlipidemia, unspecified: Secondary | ICD-10-CM

## 2023-04-22 DIAGNOSIS — I25118 Atherosclerotic heart disease of native coronary artery with other forms of angina pectoris: Secondary | ICD-10-CM | POA: Diagnosis not present

## 2023-04-22 DIAGNOSIS — I10 Essential (primary) hypertension: Secondary | ICD-10-CM

## 2023-04-22 MED ORDER — AMLODIPINE BESYLATE 5 MG PO TABS: 5.0000 mg | ORAL_TABLET | Freq: Every day | ORAL | 3 refills | Status: DC

## 2023-04-22 NOTE — Patient Instructions (Signed)
Medication Instructions:   START: Amlodipine 5mg  1 tablet daily   Lab Work: Lipid, AST, ALT-today If you have labs (blood work) drawn today and your tests are completely normal, you will receive your results only by: MyChart Message (if you have MyChart) OR A paper copy in the mail If you have any lab test that is abnormal or we need to change your treatment, we will call you to review the results.   Testing/Procedures: None Ordered   Follow-Up: At [facility], you and your health needs are our priority.  As part of our continuing mission to provide you with exceptional heart care, we have created designated Provider Care Teams.  These Care Teams include your primary Cardiologist (physician) and Advanced Practice Providers (APPs -  Physician Assistants and Nurse Practitioners) who all work together to provide you with the care you need, when you need it.  We recommend signing up for the patient portal called "MyChart".  Sign up information is provided on this After Visit Summary.  MyChart is used to connect with patients for Virtual Visits (Telemedicine).  Patients are able to view lab/test results, encounter notes, upcoming appointments, etc.  Non-urgent messages can be sent to your provider as well.   To learn more about what you can do with MyChart, go to [email or web address].    Your next appointment:   4 month(s)  The format for your next appointment:   In Person  Provider:   Gypsy Balsam, MD    Other Instructions NA

## 2023-04-22 NOTE — Addendum Note (Signed)
Addended by: Baldo Ash D on: 04/22/2023 09:05 AM   Modules accepted: Orders

## 2023-04-22 NOTE — Progress Notes (Signed)
Cardiology Office Note:    Date:  04/22/2023   ID:  Penny Boyer, DOB Jan 26, 1956, MRN [id number]  [facility]P:  [facility]  Cardiologist:  Gypsy Balsam, MD    Referring MD: [facility]   Chief Complaint  Patient presents with   Follow-up    History of Present Illness:    Penny Boyer is a 67 y.o. female past medical history significant for coronary disease in 2017 she did have PTCA and stenting of the circumflex artery additional problem include dyslipidemia, obesity, obstructive sleep apnea, history of breast CVA.  At the end of 2023 she ended up going to a cardiac Cath Lab in [place] when she was there she was found to have 90% small diagonal branch disease at rest with medical therapy only.  Comes today to my office for follow-up difficulty with the story from her she said she still got a little bit chest pain.  Happen in different situations sometimes when she sits sometimes when she walks.  She said overall she seems to be doing okay.  Past Medical History:  Diagnosis Date   Abnormal nuclear stress test 12/20/2018   Chest pain 02/02/2020   Coronary artery disease involving native coronary artery has post PTCA and stenting of the circumflex artery in March 2017 of native heart 07/28/2016   Overview:  PTCA and stent of LCX artery in March 2017 in [place]   Dyslipidemia (high LDL; low HDL) 07/28/2016   Hyperlipidemia    Hypertension    Kidney stone 10/17/2019   Precordial pain 07/28/2016   Syncope     Past Surgical History:  Procedure Laterality Date   ABDOMINAL HYSTERECTOMY     APPENDECTOMY     CARDIAC CATHETERIZATION     CHOLECYSTECTOMY     CORONARY PRESSURE/FFR STUDY N/A 12/20/2018   Procedure: INTRAVASCULAR PRESSURE WIRE/FFR STUDY;  Surgeon: Marykay Lex, MD;  Location: [facility] INVASIVE CV LAB;  Service: Cardiovascular;  Laterality: N/A;   FOOT SURGERY     LEFT HEART CATH AND CORONARY ANGIOGRAPHY N/A 12/20/2018   Procedure: LEFT HEART CATH AND CORONARY  ANGIOGRAPHY;  Surgeon: Marykay Lex, MD;  Location: [facility] INVASIVE CV LAB;  Service: Cardiovascular;  Laterality: N/A;   MANDIBLE FRACTURE SURGERY     Ulnar Surgery     WRIST FRACTURE SURGERY      Current Medications: Current Meds  Medication Sig   Alirocumab (PRALUENT) 150 MG/ML SOAJ Inject 1 mL (150 mg total) into the skin every 14 (fourteen) days.   aspirin EC 81 MG tablet Take 81 mg by mouth daily. Swallow whole.   busPIRone (BUSPAR) 10 MG tablet Take 5 mg by mouth 3 (three) times daily as needed (amxiety). Take 1/2 tablet 3 times daily   cetirizine (ZYRTEC) 10 MG tablet Take 10 mg by mouth daily.   Cholecalciferol 125 MCG (5000 UT) capsule Take 5,000 Units by mouth daily.   clopidogrel (PLAVIX) 75 MG tablet Take 1 tablet (75 mg total) by mouth daily.   isosorbide mononitrate (IMDUR) 120 MG 24 hr tablet Take 1 tablet (120 mg total) by mouth daily.   losartan (COZAAR) 100 MG tablet Take 1 tablet (100 mg total) by mouth daily.   nitroGLYCERIN (NITROSTAT) 0.4 MG SL tablet Place 1 tablet (0.4 mg total) under the tongue as needed for chest pain.   ranolazine (RANEXA) 1000 MG SR tablet Take 1 tablet (1,000 mg total) by mouth 2 (two) times daily.   rosuvastatin (CRESTOR) 5 MG tablet Take  1 tablet (5 mg total) by mouth daily.     Allergies:   Atorvastatin and Hydrochlorothiazide w-triamterene   Social History   Socioeconomic History   Marital status: Married    Spouse name: Not on file   Number of children: Not on file   Years of education: Not on file   Highest education level: Not on file  Occupational History   Not on file  Tobacco Use   Smoking status: Former   Smokeless tobacco: Never  Vaping Use   Vaping status: Never Used  Substance and Sexual Activity   Alcohol use: Yes   Drug use: No   Sexual activity: Not on file  Other Topics Concern   Not on file  Social History Narrative   Not on file   Social Determinants of Health   Financial Resource Strain: Not on file   Food Insecurity: Not on file  Transportation Needs: Not on file  Physical Activity: Not on file  Stress: No Stress Concern Present (06/18/2022)   Received from [facility], [facility]   Stress    Feeling of Stress : Not on file  Social Connections: Not on file     Family History: The patient's family history includes Cancer in her mother; Heart disease in her brother, brother, and father. ROS:   Please see the history of present illness.    All 14 point review of systems negative except as described per history of present illness  EKGs/Labs/Other Studies Reviewed:    EKG Interpretation Date/Time:  Friday April 22 2023 08:28:51 EST Ventricular Rate:  84 PR Interval:  152 QRS Duration:  78 QT Interval:  380 QTC Calculation: 449 R Axis:   91  Text Interpretation: Normal sinus rhythm Rightward axis Nonspecific T wave abnormality Abnormal ECG When compared with ECG of 02-Feb-2020 08:49, PREVIOUS ECG IS PRESENT Confirmed by Gypsy Balsam [phone]) on 04/22/2023 8:34:53 AM    Recent Labs: 10/13/2022: TSH 1.230  Recent Lipid Panel    Component Value Date/Time   CHOL 173 03/31/2021 0848   TRIG 72 03/31/2021 0848   HDL 64 03/31/2021 0848   CHOLHDL 2.7 03/31/2021 0848   CHOLHDL 4.6 02/02/2020 1010   VLDL 27 02/02/2020 1010   LDLCALC 95 03/31/2021 0848    Physical Exam:    VS:  BP (!) 160/110 (BP Location: Right Arm, Patient Position: Sitting)   Pulse 84   Ht 5\' 1"  (1.549 m)   Wt 177 lb (80.3 kg)   SpO2 98%   BMI 33.44 kg/m     Wt Readings from Last 3 Encounters:  04/22/23 177 lb (80.3 kg)  12/14/22 179 lb (81.2 kg)  10/13/22 183 lb (83 kg)     GEN:  Well nourished, well developed in no acute distress HEENT: Normal NECK: No JVD; No carotid bruits LYMPHATICS: No lymphadenopathy CARDIAC: RRR, no murmurs, no rubs, no gallops RESPIRATORY:  Clear to auscultation without rales, wheezing or rhonchi  ABDOMEN: Soft, non-tender,  non-distended MUSCULOSKELETAL:  No edema; No deformity  SKIN: Warm and dry LOWER EXTREMITIES: no swelling NEUROLOGIC:  Alert and oriented x 3 PSYCHIATRIC:  Normal affect   ASSESSMENT:    1. Coronary artery disease of native artery of native heart with stable angina pectoris (HCC)   2. Essential (primary) hypertension   3. Dyslipidemia    PLAN:    In order of problems listed above:  Coronary disease mildly abnormal stress test.  We had again discussion about what to do with  the situation since her blood pressure is elevated today we will add amlodipine 5 mg daily to her medical regimen to see if we can use this as an antianginal medication as well as blood pressure reduction.  I will see her back in my office in about 3 to 4 months to see how she does.  I told her primary care more frequent that she is tired of itching to let me know then I am afraid next movement will be to put her in the cardiac Cath Lab. Essential hypertension: Blood pressure still elevated plan is to add amlodipine. Dyslipidemia she takes PCSK9 agent which I will continue.  Will schedule her to have fasting lipid profile today.   Medication Adjustments/Labs and Tests Ordered: Current medicines are reviewed at length with the patient today.  Concerns regarding medicines are outlined above.  Orders Placed This Encounter  Procedures   EKG 12-Lead   Medication changes: No orders of the defined types were placed in this encounter.   Signed, Georgeanna Lea, MD, [facility] 04/22/2023 8:54 AM    [facility]

## 2023-04-23 LAB — LIPID PANEL
Chol/HDL Ratio: 3.1 {ratio} (ref 0.0–4.4)
Cholesterol, Total: 187 mg/dL (ref 100–199)
HDL: 60 mg/dL (ref >39)
LDL Chol Calc (NIH): 110 mg/dL — ABNORMAL HIGH (ref 0–99)
Triglycerides: 94 mg/dL (ref 0–149)
VLDL Cholesterol Cal: 17 mg/dL (ref 5–40)

## 2023-04-23 LAB — AST: AST: 14 [IU]/L (ref 0–40)

## 2023-04-23 LAB — ALT: ALT: 16 [IU]/L (ref 0–32)

## 2023-04-25 ENCOUNTER — Ambulatory Visit: Payer: No Typology Code available for payment source

## 2023-04-25 DIAGNOSIS — R55 Syncope and collapse: Secondary | ICD-10-CM

## 2023-04-25 LAB — CUP PACEART REMOTE DEVICE CHECK
Date Time Interrogation Session: 20241208230638
Implantable Pulse Generator Implant Date: 20210921

## 2023-05-04 ENCOUNTER — Telehealth: Payer: Self-pay | Admitting: Physician Assistant

## 2023-05-04 NOTE — Telephone Encounter (Signed)
She called stating she is having fluctuating BP. She started the 5 mg amlodipine but BP is very labile.   192/152 156/70 112/105  BP rechecked while we are on the phone - 120/86  She is having mild chest pain that has subsided, but not the type of chest pain she usually takes nitroglycerin for. I advised to recheck BP tonight and if elevated in the 140s, can take 2.5 mg amlodipine.   Will send a message to Dr. Kirtland Bouchard - may ultimately need to switch losartan to benicar and complete a renal artery [place].

## 2023-05-17 ENCOUNTER — Telehealth: Payer: Self-pay

## 2023-05-17 NOTE — Telephone Encounter (Signed)
 Left message on My Chart with lab results per Dr. Vanetta Shawl note. Routed to PCP.

## 2023-05-30 ENCOUNTER — Ambulatory Visit (INDEPENDENT_AMBULATORY_CARE_PROVIDER_SITE_OTHER): Payer: No Typology Code available for payment source

## 2023-05-30 DIAGNOSIS — R55 Syncope and collapse: Secondary | ICD-10-CM

## 2023-05-30 LAB — CUP PACEART REMOTE DEVICE CHECK
Date Time Interrogation Session: 20250112230250
Implantable Pulse Generator Implant Date: 20210921

## 2023-06-01 ENCOUNTER — Telehealth: Payer: Self-pay

## 2023-06-01 NOTE — Telephone Encounter (Signed)
Pt viewed results on My Chart per Dr. Krasowski's note. Routed to PCP.  

## 2023-06-02 NOTE — Progress Notes (Signed)
[facility] Summary Report / Loop Recorder 

## 2023-07-04 ENCOUNTER — Ambulatory Visit (INDEPENDENT_AMBULATORY_CARE_PROVIDER_SITE_OTHER): Payer: No Typology Code available for payment source

## 2023-07-04 DIAGNOSIS — R55 Syncope and collapse: Secondary | ICD-10-CM

## 2023-07-05 LAB — CUP PACEART REMOTE DEVICE CHECK
Date Time Interrogation Session: 20250216230300
Implantable Pulse Generator Implant Date: 20210921

## 2023-07-06 ENCOUNTER — Encounter: Payer: Self-pay | Admitting: Cardiology

## 2023-07-12 NOTE — Progress Notes (Signed)
 Carelink Summary Report / Loop Recorder

## 2023-07-28 ENCOUNTER — Telehealth: Payer: Self-pay

## 2023-07-28 ENCOUNTER — Telehealth: Payer: Self-pay | Admitting: Cardiology

## 2023-07-28 NOTE — Telephone Encounter (Signed)
 Called patient and informed her of Dr. Hulen Shouts recommendation below:   "I think she should see her family physician for evaluation for headache"  Patient verbalized understanding and had no further questions at this time.

## 2023-07-28 NOTE — Telephone Encounter (Signed)
  STAT if HR is under 50 or over 120 (normal HR is 60-100 beats per minute)  What is your heart rate?   Not available  Do you have a log of your heart rate readings (document readings)?   Not available  Do you have any other symptoms?   Really bad headache, no other symptoms  Patient stated she is feeling a "strained pulse" in her neck.  Patient noted she has taken her BP medications today. Patient stated she is at work and does not have access to check her BP or HR. Patient stated she has stents and wants advice 0n next steps.

## 2023-07-28 NOTE — Telephone Encounter (Signed)
 Called patient and she reported that she was having "really bad headaches that come and go" and behind her ears she can feel a sensation of something tightening up and then letting go. She was also audibly SOB on the phone. She also stated that she had a history of stent's in her heart. Her blood pressure this morning was 140/95.

## 2023-07-28 NOTE — Telephone Encounter (Signed)
 Spoke with pt who stated that she was having a headache and a pulsing in her artery in her neck and behind her ear. She wanted an appt. Advised to go to ER for any changes in her HA, BP or if she began feeling worse. Pt agreed and verbalized understanding and stated that she would go to the ER for any changes. She will see Dr. Bing Matter on Monday a 10am

## 2023-08-01 ENCOUNTER — Ambulatory Visit: Attending: Cardiology | Admitting: Cardiology

## 2023-08-01 ENCOUNTER — Telehealth: Payer: Self-pay | Admitting: Cardiology

## 2023-08-01 ENCOUNTER — Encounter: Payer: Self-pay | Admitting: Cardiology

## 2023-08-01 VITALS — BP 134/88 | HR 78 | Ht 61.0 in | Wt 174.4 lb

## 2023-08-01 DIAGNOSIS — G4733 Obstructive sleep apnea (adult) (pediatric): Secondary | ICD-10-CM | POA: Diagnosis not present

## 2023-08-01 DIAGNOSIS — I10 Essential (primary) hypertension: Secondary | ICD-10-CM | POA: Diagnosis not present

## 2023-08-01 DIAGNOSIS — I25118 Atherosclerotic heart disease of native coronary artery with other forms of angina pectoris: Secondary | ICD-10-CM | POA: Diagnosis not present

## 2023-08-01 DIAGNOSIS — E785 Hyperlipidemia, unspecified: Secondary | ICD-10-CM

## 2023-08-01 NOTE — Addendum Note (Signed)
 Addended by: Baldo Ash D on: 08/01/2023 10:53 AM   Modules accepted: Orders

## 2023-08-01 NOTE — H&P (View-Only) (Signed)
 Cardiology Office Note:    Date:  08/01/2023   ID:  Penny Boyer, DOB 1955-07-14, MRN [id number]  PCP:  [facility]  Cardiologist:  Gypsy Balsam, MD    Referring MD: [facility]   Chief Complaint  Patient presents with   Chest Pain   Shortness of Breath    History of Present Illness:    Penny Boyer is a 67 y.o. female past medical history significant for coronary artery disease.  In 2017 she did have PTCA and stent of the circumflex artery.  In 2023 she was in [place] at the airport passed out end up going to the hospital cardiac catheterization has been performed she was find to have 90% stenosis of the diagonal branch that was not stented medical therapy has been recommended.  Additional problem include dyslipidemia obesity obstructive sleep apnea history of breast CVA.  Comes today to my office for follow-up we have been struggling with her not feeling well she described to have shortness of breath fatigue tiredness as well as chest pain that they are relieved by nitroglycerin.  I try to maximize medical therapy as part of that she is still feeling very poorly and have a lot of chest pain.  We brought her today to the office to talk about options.  Past Medical History:  Diagnosis Date   Abnormal nuclear stress test 12/20/2018   Chest pain 02/02/2020   Coronary artery disease involving native coronary artery has post PTCA and stenting of the circumflex artery in March 2017 of native heart 07/28/2016   Overview:  PTCA and stent of LCX artery in March 2017 in [place]   Dyslipidemia (high LDL; low HDL) 07/28/2016   Hyperlipidemia    Hypertension    Kidney stone 10/17/2019   Precordial pain 07/28/2016   Syncope     Past Surgical History:  Procedure Laterality Date   ABDOMINAL HYSTERECTOMY     APPENDECTOMY     CARDIAC CATHETERIZATION     CHOLECYSTECTOMY     CORONARY PRESSURE/FFR STUDY N/A 12/20/2018   Procedure: INTRAVASCULAR PRESSURE WIRE/FFR STUDY;  Surgeon:  Marykay Lex, MD;  Location: [facility] INVASIVE CV LAB;  Service: Cardiovascular;  Laterality: N/A;   FOOT SURGERY     LEFT HEART CATH AND CORONARY ANGIOGRAPHY N/A 12/20/2018   Procedure: LEFT HEART CATH AND CORONARY ANGIOGRAPHY;  Surgeon: Marykay Lex, MD;  Location: [facility] INVASIVE CV LAB;  Service: Cardiovascular;  Laterality: N/A;   MANDIBLE FRACTURE SURGERY     Ulnar Surgery     WRIST FRACTURE SURGERY      Current Medications: Current Meds  Medication Sig   Alirocumab (PRALUENT) 150 MG/ML SOAJ Inject 1 mL (150 mg total) into the skin every 14 (fourteen) days.   amLODipine (NORVASC) 5 MG tablet Take 1 tablet (5 mg total) by mouth daily.   aspirin EC 81 MG tablet Take 81 mg by mouth daily. Swallow whole.   busPIRone (BUSPAR) 10 MG tablet Take 5 mg by mouth 3 (three) times daily as needed (amxiety). Take 1/2 tablet 3 times daily   cetirizine (ZYRTEC) 10 MG tablet Take 10 mg by mouth daily.   Cholecalciferol 125 MCG (5000 UT) capsule Take 5,000 Units by mouth daily.   clopidogrel (PLAVIX) 75 MG tablet Take 1 tablet (75 mg total) by mouth daily.   isosorbide mononitrate (IMDUR) 120 MG 24 hr tablet Take 1 tablet (120 mg total) by mouth daily.   losartan (COZAAR) 100 MG tablet Take 1 tablet (  100 mg total) by mouth daily.   nitroGLYCERIN (NITROSTAT) 0.4 MG SL tablet Place 1 tablet (0.4 mg total) under the tongue as needed for chest pain.   ranolazine (RANEXA) 1000 MG SR tablet Take 1 tablet (1,000 mg total) by mouth 2 (two) times daily.   rosuvastatin (CRESTOR) 5 MG tablet Take 1 tablet (5 mg total) by mouth daily.     Allergies:   Atorvastatin and Hydrochlorothiazide w-triamterene   Social History   Socioeconomic History   Marital status: Married    Spouse name: Not on file   Number of children: Not on file   Years of education: Not on file   Highest education level: Not on file  Occupational History   Not on file  Tobacco Use   Smoking status: Former   Smokeless tobacco: Never   Vaping Use   Vaping status: Never Used  Substance and Sexual Activity   Alcohol use: Yes   Drug use: No   Sexual activity: Not on file  Other Topics Concern   Not on file  Social History Narrative   Not on file   Social Drivers of Health   Financial Resource Strain: Not on file  Food Insecurity: Not on file  Transportation Needs: Not on file  Physical Activity: Not on file  Stress: No Stress Concern Present (06/18/2022)   Received from [facility], [facility]   Stress    Feeling of Stress : Not on file  Social Connections: Unknown (02/04/2021)   Received from [facility], [facility]   Social Isolation    How often do you see or talk to people that you care about and feel close to?: Not on file     Family History: The patient's family history includes Cancer in her mother; Heart disease in her brother, brother, and father. ROS:   Please see the history of present illness.    All 14 point review of systems negative except as described per history of present illness  EKGs/Labs/Other Studies Reviewed:    EKG Interpretation Date/Time:  Monday August 01 2023 10:01:30 EDT Ventricular Rate:  78 PR Interval:  152 QRS Duration:  88 QT Interval:  400 QTC Calculation: 456 R Axis:   17  Text Interpretation: Normal sinus rhythm Low voltage QRS Septal infarct , age undetermined Abnormal ECG When compared with ECG of 22-Apr-2023 08:28, Questionable change in QRS axis Confirmed by Gypsy Balsam [phone]) on 08/01/2023 10:17:44 AM    Recent Labs: 10/13/2022: TSH 1.230 04/22/2023: ALT 16  Recent Lipid Panel    Component Value Date/Time   CHOL 187 04/22/2023 0907   TRIG 94 04/22/2023 0907   HDL 60 04/22/2023 0907   CHOLHDL 3.1 04/22/2023 0907   CHOLHDL 4.6 02/02/2020 1010   VLDL 27 02/02/2020 1010   LDLCALC 110 (H) 04/22/2023 0907    Physical Exam:    VS:  BP 134/88 (BP Location: Right Arm, Patient Position: Sitting)   Pulse 78   Ht 5\' 1"   (1.549 m)   Wt 174 lb 6.4 oz (79.1 kg)   SpO2 96%   BMI 32.95 kg/m     Wt Readings from Last 3 Encounters:  08/01/23 174 lb 6.4 oz (79.1 kg)  04/22/23 177 lb (80.3 kg)  12/14/22 179 lb (81.2 kg)     GEN:  Well nourished, well developed in no acute distress HEENT: Normal NECK: No JVD; No carotid bruits LYMPHATICS: No lymphadenopathy CARDIAC: RRR, no murmurs, no rubs, no gallops  RESPIRATORY:  Clear to auscultation without rales, wheezing or rhonchi  ABDOMEN: Soft, non-tender, non-distended MUSCULOSKELETAL:  No edema; No deformity  SKIN: Warm and dry LOWER EXTREMITIES: no swelling NEUROLOGIC:  Alert and oriented x 3 PSYCHIATRIC:  Normal affect   ASSESSMENT:    1. Essential (primary) hypertension   2. Coronary artery disease of native artery of native heart with stable angina pectoris (HCC)   3. Obstructive sleep apnea   4. Dyslipidemia    PLAN:    In order of problems listed above:  Coronary disease status post PTCA and stenting of the circumflex artery in 2017, known 90% small diagonal branch disease, stress test done last year show ischemia in LAD territory medical therapy seems to be failing.  The only option left is cardiac catheterization.  I had a long discussion with her discussed also options of maximization of medical therapy versus another stress testing we decided to proceed with cardiac catheterization.  Procedure explained to her including all risk benefits as well as alternative. Dyslipidemia she is taking Praluent which I will continue.  I did review K PN which show me last data from April 22, 2023 LDL 110 HDL 60 we will try to maximize this therapy. Essential hypertension: Blood pressure well-controlled.  Continue present management.   Medication Adjustments/Labs and Tests Ordered: Current medicines are reviewed at length with the patient today.  Concerns regarding medicines are outlined above.  Orders Placed This Encounter  Procedures   EKG 12-Lead    Medication changes: No orders of the defined types were placed in this encounter.   Signed, Georgeanna Lea, MD, [facility] 08/01/2023 10:31 AM    [facility]

## 2023-08-01 NOTE — Progress Notes (Signed)
 Cardiology Office Note:    Date:  08/01/2023   ID:  Penny Boyer, DOB 1955-07-14, MRN [id number]  PCP:  [facility]  Cardiologist:  Gypsy Balsam, MD    Referring MD: [facility]   Chief Complaint  Patient presents with   Chest Pain   Shortness of Breath    History of Present Illness:    Penny Boyer is a 67 y.o. female past medical history significant for coronary artery disease.  In 2017 she did have PTCA and stent of the circumflex artery.  In 2023 she was in [place] at the airport passed out end up going to the hospital cardiac catheterization has been performed she was find to have 90% stenosis of the diagonal branch that was not stented medical therapy has been recommended.  Additional problem include dyslipidemia obesity obstructive sleep apnea history of breast CVA.  Comes today to my office for follow-up we have been struggling with her not feeling well she described to have shortness of breath fatigue tiredness as well as chest pain that they are relieved by nitroglycerin.  I try to maximize medical therapy as part of that she is still feeling very poorly and have a lot of chest pain.  We brought her today to the office to talk about options.  Past Medical History:  Diagnosis Date   Abnormal nuclear stress test 12/20/2018   Chest pain 02/02/2020   Coronary artery disease involving native coronary artery has post PTCA and stenting of the circumflex artery in March 2017 of native heart 07/28/2016   Overview:  PTCA and stent of LCX artery in March 2017 in [place]   Dyslipidemia (high LDL; low HDL) 07/28/2016   Hyperlipidemia    Hypertension    Kidney stone 10/17/2019   Precordial pain 07/28/2016   Syncope     Past Surgical History:  Procedure Laterality Date   ABDOMINAL HYSTERECTOMY     APPENDECTOMY     CARDIAC CATHETERIZATION     CHOLECYSTECTOMY     CORONARY PRESSURE/FFR STUDY N/A 12/20/2018   Procedure: INTRAVASCULAR PRESSURE WIRE/FFR STUDY;  Surgeon:  Marykay Lex, MD;  Location: [facility] INVASIVE CV LAB;  Service: Cardiovascular;  Laterality: N/A;   FOOT SURGERY     LEFT HEART CATH AND CORONARY ANGIOGRAPHY N/A 12/20/2018   Procedure: LEFT HEART CATH AND CORONARY ANGIOGRAPHY;  Surgeon: Marykay Lex, MD;  Location: [facility] INVASIVE CV LAB;  Service: Cardiovascular;  Laterality: N/A;   MANDIBLE FRACTURE SURGERY     Ulnar Surgery     WRIST FRACTURE SURGERY      Current Medications: Current Meds  Medication Sig   Alirocumab (PRALUENT) 150 MG/ML SOAJ Inject 1 mL (150 mg total) into the skin every 14 (fourteen) days.   amLODipine (NORVASC) 5 MG tablet Take 1 tablet (5 mg total) by mouth daily.   aspirin EC 81 MG tablet Take 81 mg by mouth daily. Swallow whole.   busPIRone (BUSPAR) 10 MG tablet Take 5 mg by mouth 3 (three) times daily as needed (amxiety). Take 1/2 tablet 3 times daily   cetirizine (ZYRTEC) 10 MG tablet Take 10 mg by mouth daily.   Cholecalciferol 125 MCG (5000 UT) capsule Take 5,000 Units by mouth daily.   clopidogrel (PLAVIX) 75 MG tablet Take 1 tablet (75 mg total) by mouth daily.   isosorbide mononitrate (IMDUR) 120 MG 24 hr tablet Take 1 tablet (120 mg total) by mouth daily.   losartan (COZAAR) 100 MG tablet Take 1 tablet (  100 mg total) by mouth daily.   nitroGLYCERIN (NITROSTAT) 0.4 MG SL tablet Place 1 tablet (0.4 mg total) under the tongue as needed for chest pain.   ranolazine (RANEXA) 1000 MG SR tablet Take 1 tablet (1,000 mg total) by mouth 2 (two) times daily.   rosuvastatin (CRESTOR) 5 MG tablet Take 1 tablet (5 mg total) by mouth daily.     Allergies:   Atorvastatin and Hydrochlorothiazide w-triamterene   Social History   Socioeconomic History   Marital status: Married    Spouse name: Not on file   Number of children: Not on file   Years of education: Not on file   Highest education level: Not on file  Occupational History   Not on file  Tobacco Use   Smoking status: Former   Smokeless tobacco: Never   Vaping Use   Vaping status: Never Used  Substance and Sexual Activity   Alcohol use: Yes   Drug use: No   Sexual activity: Not on file  Other Topics Concern   Not on file  Social History Narrative   Not on file   Social Drivers of Health   Financial Resource Strain: Not on file  Food Insecurity: Not on file  Transportation Needs: Not on file  Physical Activity: Not on file  Stress: No Stress Concern Present (06/18/2022)   Received from [facility], [facility]   Stress    Feeling of Stress : Not on file  Social Connections: Unknown (02/04/2021)   Received from [facility], [facility]   Social Isolation    How often do you see or talk to people that you care about and feel close to?: Not on file     Family History: The patient's family history includes Cancer in her mother; Heart disease in her brother, brother, and father. ROS:   Please see the history of present illness.    All 14 point review of systems negative except as described per history of present illness  EKGs/Labs/Other Studies Reviewed:    EKG Interpretation Date/Time:  Monday August 01 2023 10:01:30 EDT Ventricular Rate:  78 PR Interval:  152 QRS Duration:  88 QT Interval:  400 QTC Calculation: 456 R Axis:   17  Text Interpretation: Normal sinus rhythm Low voltage QRS Septal infarct , age undetermined Abnormal ECG When compared with ECG of 22-Apr-2023 08:28, Questionable change in QRS axis Confirmed by Gypsy Balsam [phone]) on 08/01/2023 10:17:44 AM    Recent Labs: 10/13/2022: TSH 1.230 04/22/2023: ALT 16  Recent Lipid Panel    Component Value Date/Time   CHOL 187 04/22/2023 0907   TRIG 94 04/22/2023 0907   HDL 60 04/22/2023 0907   CHOLHDL 3.1 04/22/2023 0907   CHOLHDL 4.6 02/02/2020 1010   VLDL 27 02/02/2020 1010   LDLCALC 110 (H) 04/22/2023 0907    Physical Exam:    VS:  BP 134/88 (BP Location: Right Arm, Patient Position: Sitting)   Pulse 78   Ht 5\' 1"   (1.549 m)   Wt 174 lb 6.4 oz (79.1 kg)   SpO2 96%   BMI 32.95 kg/m     Wt Readings from Last 3 Encounters:  08/01/23 174 lb 6.4 oz (79.1 kg)  04/22/23 177 lb (80.3 kg)  12/14/22 179 lb (81.2 kg)     GEN:  Well nourished, well developed in no acute distress HEENT: Normal NECK: No JVD; No carotid bruits LYMPHATICS: No lymphadenopathy CARDIAC: RRR, no murmurs, no rubs, no gallops  RESPIRATORY:  Clear to auscultation without rales, wheezing or rhonchi  ABDOMEN: Soft, non-tender, non-distended MUSCULOSKELETAL:  No edema; No deformity  SKIN: Warm and dry LOWER EXTREMITIES: no swelling NEUROLOGIC:  Alert and oriented x 3 PSYCHIATRIC:  Normal affect   ASSESSMENT:    1. Essential (primary) hypertension   2. Coronary artery disease of native artery of native heart with stable angina pectoris (HCC)   3. Obstructive sleep apnea   4. Dyslipidemia    PLAN:    In order of problems listed above:  Coronary disease status post PTCA and stenting of the circumflex artery in 2017, known 90% small diagonal branch disease, stress test done last year show ischemia in LAD territory medical therapy seems to be failing.  The only option left is cardiac catheterization.  I had a long discussion with her discussed also options of maximization of medical therapy versus another stress testing we decided to proceed with cardiac catheterization.  Procedure explained to her including all risk benefits as well as alternative. Dyslipidemia she is taking Praluent which I will continue.  I did review K PN which show me last data from April 22, 2023 LDL 110 HDL 60 we will try to maximize this therapy. Essential hypertension: Blood pressure well-controlled.  Continue present management.   Medication Adjustments/Labs and Tests Ordered: Current medicines are reviewed at length with the patient today.  Concerns regarding medicines are outlined above.  Orders Placed This Encounter  Procedures   EKG 12-Lead    Medication changes: No orders of the defined types were placed in this encounter.   Signed, Georgeanna Lea, MD, [facility] 08/01/2023 10:31 AM    [facility]

## 2023-08-01 NOTE — Telephone Encounter (Signed)
 Patient stated she will need [facility] Bing Matter [facility] and re-author[facility] her procedure tomorrow as the authorization has expired.  Phone number - [phone], ask for [facility].  Patient wants a call back to confirm.

## 2023-08-01 NOTE — Telephone Encounter (Signed)
 Called [facility], lines shut down at 4:15pm. Notified pt that we would try again in the morning. Her cardiac cath is scheduled in the morning 08-02-23 at 7:30AM

## 2023-08-01 NOTE — Patient Instructions (Signed)
 Medication Instructions:   HOLD: Losartan morning of procedure  TAKE: Plavix morning of Procedure     *If you need a refill on your cardiac medications before your next appointment, please call your pharmacy*   Lab Work: Your physician recommends that you return for lab work in: when lab open You need to have labs done when you are fasting.  You can come Monday through Friday 8:30 am to 12:00 pm and 1:15 to 4:30. You do not need to make an appointment as the order has already been placed. The labs you are going to have done are BMET, CBC,    Testing/Procedures:  Penny Boyer A DEPT OF [facility] AT [place] [address] [place] [address] Dept: [phone] Loc: [phone]  Penny Boyer  08/01/2023  You are scheduled for a Cardiac Catheterization on Tuesday, March 18 with Dr. Peter Swaziland.  1. Please arrive at the [facility] (Main Entrance A) at [facility]: [address] [place], [place] [address] at 5:30 AM (This time is 2 hour(s) before your procedure to ensure your preparation).   Free valet parking service is available. You will check in at ADMITTING. The support person will be asked to wait in the waiting room.  It is OK to have someone drop you off and come back when you are ready to be discharged.    Special note: Every effort is made to have your procedure done on time. Please understand that emergencies sometimes delay scheduled procedures.  2. Diet: Do not eat solid foods after midnight.  The patient may have clear liquids until 5am upon the day of the procedure.  3. Labs: You will need to have blood drawn on Monday, March 17 at [facility]: [address], [place], [place] . You do not need to be fasting.  4. Medication instructions in preparation for your procedure:   Contrast Allergy: No    Current Outpatient Medications (Cardiovascular):    Alirocumab (PRALUENT) 150 MG/ML SOAJ, Inject 1  mL (150 mg total) into the skin every 14 (fourteen) days.   amLODipine (NORVASC) 5 MG tablet, Take 1 tablet (5 mg total) by mouth daily.   isosorbide mononitrate (IMDUR) 120 MG 24 hr tablet, Take 1 tablet (120 mg total) by mouth daily.   losartan (COZAAR) 100 MG tablet, Take 1 tablet (100 mg total) by mouth daily.   nitroGLYCERIN (NITROSTAT) 0.4 MG SL tablet, Place 1 tablet (0.4 mg total) under the tongue as needed for chest pain.   ranolazine (RANEXA) 1000 MG SR tablet, Take 1 tablet (1,000 mg total) by mouth 2 (two) times daily.   rosuvastatin (CRESTOR) 5 MG tablet, Take 1 tablet (5 mg total) by mouth daily.  Current Outpatient Medications (Respiratory):    cetirizine (ZYRTEC) 10 MG tablet, Take 10 mg by mouth daily.  Current Outpatient Medications (Analgesics):    aspirin EC 81 MG tablet, Take 81 mg by mouth daily. Swallow whole.  Current Outpatient Medications (Hematological):    clopidogrel (PLAVIX) 75 MG tablet, Take 1 tablet (75 mg total) by mouth daily.  Current Outpatient Medications (Other):    busPIRone (BUSPAR) 10 MG tablet, Take 5 mg by mouth 3 (three) times daily as needed (amxiety). Take 1/2 tablet 3 times daily   Cholecalciferol 125 MCG (5000 UT) capsule, Take 5,000 Units by mouth daily. *For reference purposes while preparing patient instructions.   Delete this med list prior to printing instructions for patient.*  Stop Losartan - Tuesday August 02, 2023      On the morning of your procedure, take your Aspirin 81 mg and any morning medicines NOT listed above.  You may use sips of water.  5. Plan to go home the same day, you will only stay overnight if medically necessary. 6. Bring a current list of your medications and current insurance cards. 7. You MUST have a responsible person to drive you home. 8. Someone MUST be with you the first 24 hours after you arrive home or your discharge will be delayed. 9. Please wear clothes that are easy to get on and off and  wear slip-on shoes.  Thank you for allowing [place] to care for you!   -- [facility]    Follow-Up: At [facility], you and your health needs are our priority.  As part of our continuing mission to provide you with exceptional heart care, we have created designated Provider Care Teams.  These Care Teams include your primary Cardiologist (physician) and Advanced Practice Providers (APPs -  Physician Assistants and Nurse Practitioners) who all work together to provide you with the care you need, when you need it.  We recommend signing up for the patient portal called "MyChart".  Sign up information is provided on this After Visit Summary.  MyChart is used to connect with patients for Virtual Visits (Telemedicine).  Patients are able to view lab/test results, encounter notes, upcoming appointments, etc.  Non-urgent messages can be sent to your provider as well.   To learn more about what you can do with MyChart, go to [email or web address].    Your next appointment:   3 month(s)  The format for your next appointment:   In Person  Provider:   Gypsy Balsam, MD   Other Instructions  Coronary Angiogram With Stent Coronary angiogram with stent placement is a procedure to widen or open a narrow blood vessel of the heart (coronary artery). Arteries may become blocked by cholesterol buildup (plaques) in the lining of the artery wall. When a coronary artery becomes partially blocked, blood flow to that area decreases. This may lead to chest pain or a heart attack (myocardial infarction). A stent is a small piece of metal that looks like mesh or spring. Stent placement may be done as treatment after a heart attack, or to prevent a heart attack if a blocked artery is found by a coronary angiogram. Let your health care provider know about: Any allergies you have, including allergies to medicines or contrast dye. All medicines you are taking, including vitamins,  herbs, eye drops, creams, and over-the-counter medicines. Any problems you or family members have had with anesthetic medicines. Any blood disorders you have. Any surgeries you have had. Any medical conditions you have, including kidney problems or kidney failure. Whether you are pregnant or may be pregnant. Whether you are breastfeeding. What are the risks? Generally, this is a safe procedure. However, serious problems may occur, including: Damage to nearby structures or organs, such as the heart, blood vessels, or kidneys. A return of blockage. Bleeding, infection, or bruising at the insertion site. A collection of blood under the skin (hematoma) at the insertion site. A blood clot in another part of the body. Allergic reaction to medicines or dyes. Bleeding into the abdomen (retroperitoneal bleeding). Stroke (rare). Heart attack (rare). What happens before the procedure? Staying hydrated Follow instructions from your health care provider about hydration, which may include: Up to 2 hours before the procedure -  you may continue to drink clear liquids, such as water, clear fruit juice, black coffee, and plain tea.    Eating and drinking restrictions Follow instructions from your health care provider about eating and drinking, which may include: 8 hours before the procedure - stop eating heavy meals or foods, such as meat, fried foods, or fatty foods. 6 hours before the procedure - stop eating light meals or foods, such as toast or cereal. 2 hours before the procedure - stop drinking clear liquids. Medicines Ask your health care provider about: Changing or stopping your regular medicines. This is especially important if you are taking diabetes medicines or blood thinners. Taking medicines such as aspirin and ibuprofen. These medicines can thin your blood. Do not take these medicines unless your health care provider tells you to take them. Generally, aspirin is recommended before a thin  tube, called a catheter, is passed through a blood vessel and inserted into the heart (cardiac catheterization). Taking over-the-counter medicines, vitamins, herbs, and supplements. General instructions Do not use any products that contain nicotine or tobacco for at least 4 weeks before the procedure. These products include cigarettes, e-cigarettes, and chewing tobacco. If you need help quitting, ask your health care provider. Plan to have someone take you home from the hospital or clinic. If you will be going home right after the procedure, plan to have someone with you for 24 hours. You may have tests and imaging procedures. Ask your health care provider: How your insertion site will be marked. Ask which artery will be used for the procedure. What steps will be taken to help prevent infection. These may include: Removing hair at the insertion site. Washing skin with a germ-killing soap. Taking antibiotic medicine. What happens during the procedure? An IV will be inserted into one of your veins. Electrodes may be placed on your chest to monitor your heart rate during the procedure. You will be given one or more of the following: A medicine to help you relax (sedative). A medicine to numb the area (local anesthetic) for catheter insertion. A small incision will be made for catheter insertion. The catheter will be inserted into an artery using a guide wire. The location may be in your groin, your wrist, or the fold of your arm (near your elbow). An X-ray procedure (fluoroscopy) will be used to help guide the catheter to the opening of the heart arteries. A dye will be injected into the catheter. X-rays will be taken. The dye helps to show where any narrowing or blockages are located in the arteries. Tell your health care provider if you have chest pain or trouble breathing. A tiny wire will be guided to the blocked spot, and a balloon will be inflated to make the artery wider. The stent will  be expanded to crush the plaques into the wall of the vessel. The stent will hold the area open and improve the blood flow. Most stents have a drug coating to reduce the risk of the stent narrowing over time. The artery may be made wider using a drill, laser, or other tools that remove plaques. The catheter will be removed when the blood flow improves. The stent will stay where it was placed, and the lining of the artery will grow over it. A bandage (dressing) will be placed on the insertion site. Pressure will be applied to stop bleeding. The IV will be removed. This procedure may vary among health care providers and hospitals.    What happens after the  procedure? Your blood pressure, heart rate, breathing rate, and blood oxygen level will be monitored until you leave the hospital or clinic. If the procedure is done through the leg, you will lie flat in bed for a few hours or for as long as told by your health care provider. You will be instructed not to bend or cross your legs. The insertion site and the pulse in your foot or wrist will be checked often. You may have more blood tests, X-rays, and a test that records the electrical activity of your heart (electrocardiogram, or ECG). Do not drive for 24 hours if you were given a sedative during your procedure. Summary Coronary angiogram with stent placement is a procedure to widen or open a narrowed coronary artery. This is done to treat heart problems. Before the procedure, let your health care provider know about all the medical conditions and surgeries you have or have had. This is a safe procedure. However, some problems may occur, including damage to nearby structures or organs, bleeding, blood clots, or allergies. Follow your health care provider's instructions about eating, drinking, medicines, and other lifestyle changes, such as quitting tobacco use before the procedure. This information is not intended to replace advice given to you by  your health care provider. Make sure you discuss any questions you have with your health care provider. Document Revised: 11/22/2018 Document Reviewed: 11/22/2018 Elsevier Patient Education  2021 [employer].

## 2023-08-02 ENCOUNTER — Encounter (HOSPITAL_COMMUNITY): Payer: Self-pay | Admitting: Cardiology

## 2023-08-02 ENCOUNTER — Ambulatory Visit (HOSPITAL_COMMUNITY)
Admission: RE | Admit: 2023-08-02 | Discharge: 2023-08-02 | Disposition: A | Discharge status: Home / Self Care | Attending: Cardiology | Admitting: Cardiology

## 2023-08-02 ENCOUNTER — Other Ambulatory Visit: Payer: Self-pay

## 2023-08-02 ENCOUNTER — Encounter (HOSPITAL_COMMUNITY)
Admission: RE | Disposition: A | Discharge status: Home / Self Care | Payer: Self-pay | Source: Home / Self Care | Attending: Cardiology

## 2023-08-02 DIAGNOSIS — R079 Chest pain, unspecified: Secondary | ICD-10-CM | POA: Diagnosis present

## 2023-08-02 DIAGNOSIS — Z8673 Personal history of transient ischemic attack (TIA), and cerebral infarction without residual deficits: Secondary | ICD-10-CM | POA: Diagnosis not present

## 2023-08-02 DIAGNOSIS — Z6832 Body mass index (BMI) 32.0-32.9, adult: Secondary | ICD-10-CM | POA: Insufficient documentation

## 2023-08-02 DIAGNOSIS — E669 Obesity, unspecified: Secondary | ICD-10-CM | POA: Diagnosis not present

## 2023-08-02 DIAGNOSIS — Z87891 Personal history of nicotine dependence: Secondary | ICD-10-CM | POA: Insufficient documentation

## 2023-08-02 DIAGNOSIS — E785 Hyperlipidemia, unspecified: Secondary | ICD-10-CM | POA: Insufficient documentation

## 2023-08-02 DIAGNOSIS — R072 Precordial pain: Secondary | ICD-10-CM

## 2023-08-02 DIAGNOSIS — G4733 Obstructive sleep apnea (adult) (pediatric): Secondary | ICD-10-CM | POA: Insufficient documentation

## 2023-08-02 DIAGNOSIS — I10 Essential (primary) hypertension: Secondary | ICD-10-CM | POA: Insufficient documentation

## 2023-08-02 DIAGNOSIS — I2510 Atherosclerotic heart disease of native coronary artery without angina pectoris: Secondary | ICD-10-CM | POA: Diagnosis not present

## 2023-08-02 DIAGNOSIS — Z8249 Family history of ischemic heart disease and other diseases of the circulatory system: Secondary | ICD-10-CM | POA: Diagnosis not present

## 2023-08-02 DIAGNOSIS — Z955 Presence of coronary angioplasty implant and graft: Secondary | ICD-10-CM | POA: Insufficient documentation

## 2023-08-02 HISTORY — PX: LEFT HEART CATH AND CORONARY ANGIOGRAPHY: CATH118249

## 2023-08-02 LAB — BASIC METABOLIC PANEL
BUN/Creatinine Ratio: 25 (ref 12–28)
BUN: 21 mg/dL (ref 8–27)
CO2: 21 mmol/L (ref 20–29)
Calcium: 9.7 mg/dL (ref 8.7–10.3)
Chloride: 103 mmol/L (ref 96–106)
Creatinine, Ser: 0.85 mg/dL (ref 0.57–1.00)
Glucose: 110 mg/dL — ABNORMAL HIGH (ref 70–99)
Potassium: 3.9 mmol/L (ref 3.5–5.2)
Sodium: 139 mmol/L (ref 134–144)
eGFR: 76 mL/min/{1.73_m2} (ref >59)

## 2023-08-02 LAB — CBC
Hematocrit: 40.8 % (ref 34.0–46.6)
Hemoglobin: 13.8 g/dL (ref 11.1–15.9)
MCH: 30.1 pg (ref 26.6–33.0)
MCHC: 33.8 g/dL (ref 31.5–35.7)
MCV: 89 fL (ref 79–97)
Platelets: 286 10*3/uL (ref 150–450)
RBC: 4.59 x10E6/uL (ref 3.77–5.28)
RDW: 12.4 % (ref 11.7–15.4)
WBC: 6.7 10*3/uL (ref 3.4–10.8)

## 2023-08-02 SURGERY — LEFT HEART CATH AND CORONARY ANGIOGRAPHY
Anesthesia: LOCAL

## 2023-08-02 MED ORDER — IOHEXOL 350 MG/ML SOLN
INTRAVENOUS | Status: DC | PRN
  Administered 2023-08-02: 49 mL

## 2023-08-02 MED ORDER — VERAPAMIL HCL 2.5 MG/ML IV SOLN
INTRAVENOUS | Status: DC | PRN
  Administered 2023-08-02: 10 mL via INTRA_ARTERIAL

## 2023-08-02 MED ORDER — ONDANSETRON HCL 4 MG/2ML IJ SOLN
INTRAMUSCULAR | Status: DC | PRN
  Administered 2023-08-02: 4 mg via INTRAVENOUS

## 2023-08-02 MED ORDER — SODIUM CHLORIDE 0.9% FLUSH: 3.0000 mL | Freq: Two times a day (BID) | INTRAVENOUS | Status: DC

## 2023-08-02 MED ORDER — FENTANYL CITRATE (PF) 100 MCG/2ML IJ SOLN
INTRAMUSCULAR | Status: AC
  Filled 2023-08-02: qty 2

## 2023-08-02 MED ORDER — SODIUM CHLORIDE 0.9 % WEIGHT BASED INFUSION: 3.0000 mL/kg/h | INTRAVENOUS | Status: AC

## 2023-08-02 MED ORDER — ASPIRIN 81 MG PO CHEW: 81.0000 mg | CHEWABLE_TABLET | ORAL | Status: DC

## 2023-08-02 MED ORDER — ACETAMINOPHEN 325 MG PO TABS
650.0000 mg | ORAL_TABLET | ORAL | Status: DC | PRN
Start: 2023-08-02 — End: 2023-08-02
  Administered 2023-08-02: 650 mg via ORAL
  Filled 2023-08-02: qty 2

## 2023-08-02 MED ORDER — LIDOCAINE HCL (PF) 1 % IJ SOLN
INTRAMUSCULAR | Status: DC | PRN
  Administered 2023-08-02: 2 mL via INTRADERMAL

## 2023-08-02 MED ORDER — LIDOCAINE HCL (PF) 1 % IJ SOLN
INTRAMUSCULAR | Status: AC
Start: 2023-08-02 — End: ?
  Filled 2023-08-02: qty 30

## 2023-08-02 MED ORDER — CLOPIDOGREL BISULFATE 75 MG PO TABS: 75.0000 mg | ORAL_TABLET | ORAL | Status: DC

## 2023-08-02 MED ORDER — ONDANSETRON HCL 4 MG/2ML IJ SOLN
INTRAMUSCULAR | Status: AC
  Filled 2023-08-02: qty 2

## 2023-08-02 MED ORDER — VERAPAMIL HCL 2.5 MG/ML IV SOLN
INTRAVENOUS | Status: AC
  Filled 2023-08-02: qty 2

## 2023-08-02 MED ORDER — ONDANSETRON HCL 4 MG/2ML IJ SOLN: 4.0000 mg | Freq: Four times a day (QID) | INTRAMUSCULAR | Status: DC | PRN

## 2023-08-02 MED ORDER — MIDAZOLAM HCL 2 MG/2ML IJ SOLN
INTRAMUSCULAR | Status: AC
  Filled 2023-08-02: qty 2

## 2023-08-02 MED ORDER — FENTANYL CITRATE (PF) 100 MCG/2ML IJ SOLN
INTRAMUSCULAR | Status: DC | PRN
  Administered 2023-08-02: 25 ug via INTRAVENOUS

## 2023-08-02 MED ORDER — SODIUM CHLORIDE 0.9 % WEIGHT BASED INFUSION: 1.0000 mL/kg/h | INTRAVENOUS | Status: DC

## 2023-08-02 MED ORDER — MIDAZOLAM HCL 2 MG/2ML IJ SOLN
INTRAMUSCULAR | Status: DC | PRN
  Administered 2023-08-02: 2 mg via INTRAVENOUS

## 2023-08-02 MED ORDER — HEPARIN SODIUM (PORCINE) 1000 UNIT/ML IJ SOLN
INTRAMUSCULAR | Status: DC | PRN
  Administered 2023-08-02: 4000 [IU] via INTRAVENOUS

## 2023-08-02 MED ORDER — HEPARIN (PORCINE) IN NACL 1000-0.9 UT/500ML-% IV SOLN
INTRAVENOUS | Status: DC | PRN
  Administered 2023-08-02 (×2): 500 mL

## 2023-08-02 MED ORDER — HEPARIN SODIUM (PORCINE) 1000 UNIT/ML IJ SOLN
INTRAMUSCULAR | Status: AC
  Filled 2023-08-02: qty 10

## 2023-08-02 MED ORDER — SODIUM CHLORIDE 0.9 % IV SOLN: 250.0000 mL | INTRAVENOUS | Status: DC | PRN

## 2023-08-02 MED ORDER — SODIUM CHLORIDE 0.9% FLUSH: 3.0000 mL | INTRAVENOUS | Status: DC | PRN

## 2023-08-02 SURGICAL SUPPLY — 8 items
CATH 5FR JL3.5 JR4 ANG PIG MP (CATHETERS) IMPLANT
DEVICE RAD COMP TR BAND LRG (VASCULAR PRODUCTS) IMPLANT
GLIDESHEATH SLEND A-KIT 6F 22G (SHEATH) IMPLANT
GUIDEWIRE INQWIRE 1.5J.035X260 (WIRE) IMPLANT
INQWIRE 1.5J .035X260CM (WIRE) ×1 IMPLANT
KIT SINGLE USE MANIFOLD (KITS) IMPLANT
PACK CARDIAC CATHETERIZATION (CUSTOM PROCEDURE TRAY) ×1 IMPLANT
SET ATX-X65L (MISCELLANEOUS) IMPLANT

## 2023-08-02 NOTE — Telephone Encounter (Signed)
 Called.

## 2023-08-02 NOTE — Telephone Encounter (Signed)
 Called and spoke with [facility] who states that they do not have a referral noted. Advised that I will send to the prior auth team to complete.

## 2023-08-02 NOTE — Telephone Encounter (Signed)
 Spoke to [facility] who states that the PCP needs to be advised to start referral. Unable to call PCP as we do not have them listed. Pt is undergoing cath today.

## 2023-08-02 NOTE — Interval H&P Note (Signed)
 History and Physical Interval Note:  08/02/2023 7:08 AM  Penny Boyer  has presented today for surgery, with the diagnosis of cad - angina.  The various methods of treatment have been discussed with the patient and family. After consideration of risks, benefits and other options for treatment, the patient has consented to  Procedure(s): LEFT HEART CATH AND CORONARY ANGIOGRAPHY (N/A) as a surgical intervention.  The patient's history has been reviewed, patient examined, no change in status, stable for surgery.  I have reviewed the patient's chart and labs.  Questions were answered to the patient's satisfaction.   Cath Lab Visit (complete for each Cath Lab visit)  Clinical Evaluation Leading to the Procedure:   ACS: No.  Non-ACS:    Anginal Classification: CCS III  Anti-ischemic medical therapy: Maximal Therapy (2 or more classes of medications)  Non-Invasive Test Results: No non-invasive testing performed  Prior CABG: No previous CABG        Theron Arista [facility] 08/02/2023 7:08 AM

## 2023-08-03 ENCOUNTER — Telehealth: Payer: Self-pay

## 2023-08-03 MED ORDER — AMLODIPINE BESYLATE 10 MG PO TABS: 10.0000 mg | ORAL_TABLET | Freq: Every day | ORAL | 3 refills | Status: DC

## 2023-08-03 NOTE — Telephone Encounter (Signed)
 Spoke with pt. [facility] Pincus Large. Sent message to Jonette Pesa for [facility] assistance.

## 2023-08-03 NOTE — Telephone Encounter (Signed)
 Dr. Bing Matter reviewed Cardiac cath. He recommended that she increase Amlodipine to 10mg  daily and follow up in 3 months or sooner if she has any problems or concerns. Amlodipine sent to [place].

## 2023-08-04 NOTE — Telephone Encounter (Signed)
 See other phone note

## 2023-08-08 ENCOUNTER — Ambulatory Visit (INDEPENDENT_AMBULATORY_CARE_PROVIDER_SITE_OTHER): Payer: No Typology Code available for payment source

## 2023-08-08 DIAGNOSIS — R55 Syncope and collapse: Secondary | ICD-10-CM | POA: Diagnosis not present

## 2023-08-08 LAB — CUP PACEART REMOTE DEVICE CHECK
Date Time Interrogation Session: 20250323230238
Implantable Pulse Generator Implant Date: 20210921

## 2023-08-12 NOTE — Addendum Note (Signed)
 Addended by: Geralyn Flash D on: 08/12/2023 11:57 AM   Modules accepted: Orders

## 2023-08-12 NOTE — Progress Notes (Signed)
 Carelink Summary Report / Loop Recorder

## 2023-08-13 ENCOUNTER — Encounter: Payer: Self-pay | Admitting: Cardiology

## 2023-09-12 ENCOUNTER — Ambulatory Visit (INDEPENDENT_AMBULATORY_CARE_PROVIDER_SITE_OTHER): Payer: No Typology Code available for payment source

## 2023-09-12 ENCOUNTER — Encounter: Payer: Self-pay | Admitting: Cardiology

## 2023-09-12 DIAGNOSIS — R55 Syncope and collapse: Secondary | ICD-10-CM | POA: Diagnosis not present

## 2023-09-12 LAB — CUP PACEART REMOTE DEVICE CHECK
Date Time Interrogation Session: 20250427230315
Implantable Pulse Generator Implant Date: 20210921

## 2023-09-26 NOTE — Progress Notes (Signed)
 Carelink Summary Report / Loop Recorder

## 2023-10-13 ENCOUNTER — Ambulatory Visit

## 2023-10-13 DIAGNOSIS — R55 Syncope and collapse: Secondary | ICD-10-CM

## 2023-10-13 LAB — CUP PACEART REMOTE DEVICE CHECK
Date Time Interrogation Session: 20250528230150
Implantable Pulse Generator Implant Date: 20210921

## 2023-10-16 ENCOUNTER — Ambulatory Visit: Payer: Self-pay | Admitting: Cardiology

## 2023-11-01 ENCOUNTER — Ambulatory Visit: Attending: Cardiology | Admitting: Cardiology

## 2023-11-01 VITALS — BP 110/82 | HR 88 | Ht 61.0 in | Wt 173.0 lb

## 2023-11-01 DIAGNOSIS — E785 Hyperlipidemia, unspecified: Secondary | ICD-10-CM | POA: Diagnosis not present

## 2023-11-01 DIAGNOSIS — I25118 Atherosclerotic heart disease of native coronary artery with other forms of angina pectoris: Secondary | ICD-10-CM

## 2023-11-01 NOTE — Patient Instructions (Signed)
Medication Instructions:  Your physician recommends that you continue on your current medications as directed. Please refer to the Current Medication list given to you today.  *If you need a refill on your cardiac medications before your next appointment, please call your pharmacy*   Lab Work: Lipid panel- today If you have labs (blood work) drawn today and your tests are completely normal, you will receive your results only by: MyChart Message (if you have MyChart) OR A paper copy in the mail If you have any lab test that is abnormal or we need to change your treatment, we will call you to review the results.   Testing/Procedures: None Ordered   Follow-Up: At [facility], you and your health needs are our priority.  As part of our continuing mission to provide you with exceptional heart care, we have created designated Provider Care Teams.  These Care Teams include your primary Cardiologist (physician) and Advanced Practice Providers (APPs -  Physician Assistants and Nurse Practitioners) who all work together to provide you with the care you need, when you need it.  We recommend signing up for the patient portal called "MyChart".  Sign up information is provided on this After Visit Summary.  MyChart is used to connect with patients for Virtual Visits (Telemedicine).  Patients are able to view lab/test results, encounter notes, upcoming appointments, etc.  Non-urgent messages can be sent to your provider as well.   To learn more about what you can do with MyChart, go to [email or web address].    Your next appointment:   6 month(s)  The format for your next appointment:   In Person  Provider:   Robert Krasowski, MD    Other Instructions NA  

## 2023-11-01 NOTE — Progress Notes (Signed)
 Cardiology Office Note:    Date:  11/01/2023   ID:  Penny Boyer, DOB 03/23/1956, MRN [id number]  PCP:  [facility]  Cardiologist:  Ralene Burger, MD    Referring MD: [facility]   Chief Complaint  Patient presents with   Follow-up    History of Present Illness:    Penny Boyer is a 68 y.o. female past medical history significant for coronary artery disease in 2017 she did have PTCA and stent of circumflex artery in 2023 she was in [place] passed out and not going to hospital cardiac catheterization has been performed and she was found to have diagonal branch disease 90% too small to be stented since that time she has been doing fair.  Dyslipidemia obesity obstructive sleep apnea history of breast CA comes today 2 months for follow-up she was struggling last time we ended up repeating cardiac catheterization which showed absolutely the same finding that were noted in cardiac catheterization from 2023 I did cut down her amlodipine  since that time she is feeling much better she says she is fine she started exercising again overall looking good feeling well no syncope  Past Medical History:  Diagnosis Date   Abnormal nuclear stress test 12/20/2018   Chest pain 02/02/2020   Coronary artery disease involving native coronary artery has post PTCA and stenting of the circumflex artery in March 2017 of native heart 07/28/2016   Overview:  PTCA and stent of LCX artery in March 2017 in [place]   Dyslipidemia (high LDL; low HDL) 07/28/2016   Hyperlipidemia    Hypertension    Kidney stone 10/17/2019   Precordial pain 07/28/2016   Syncope     Past Surgical History:  Procedure Laterality Date   ABDOMINAL HYSTERECTOMY     APPENDECTOMY     CARDIAC CATHETERIZATION     CHOLECYSTECTOMY     CORONARY PRESSURE/FFR STUDY N/A 12/20/2018   Procedure: INTRAVASCULAR PRESSURE WIRE/FFR STUDY;  Surgeon: Arleen Lacer, MD;  Location: [facility] INVASIVE CV LAB;  Service: Cardiovascular;   Laterality: N/A;   FOOT SURGERY     LEFT HEART CATH AND CORONARY ANGIOGRAPHY N/A 12/20/2018   Procedure: LEFT HEART CATH AND CORONARY ANGIOGRAPHY;  Surgeon: Arleen Lacer, MD;  Location: [facility] INVASIVE CV LAB;  Service: Cardiovascular;  Laterality: N/A;   LEFT HEART CATH AND CORONARY ANGIOGRAPHY N/A 08/02/2023   Procedure: LEFT HEART CATH AND CORONARY ANGIOGRAPHY;  Surgeon: Swaziland, Peter M, MD;  Location: [facility] INVASIVE CV LAB;  Service: Cardiovascular;  Laterality: N/A;   MANDIBLE FRACTURE SURGERY     Ulnar Surgery     WRIST FRACTURE SURGERY      Current Medications: No outpatient medications have been marked as taking for the 11/01/23 encounter (Office Visit) with Reaghan Kawa J, MD.     Allergies:   Atorvastatin and Hydrochlorothiazide-triamterene   Social History   Socioeconomic History   Marital status: Married    Spouse name: Not on file   Number of children: Not on file   Years of education: Not on file   Highest education level: Not on file  Occupational History   Not on file  Tobacco Use   Smoking status: Former   Smokeless tobacco: Never  Vaping Use   Vaping status: Never Used  Substance and Sexual Activity   Alcohol use: Yes   Drug use: No   Sexual activity: Not on file  Other Topics Concern   Not on file  Social History Narrative  Not on file   Social Drivers of Health   Financial Resource Strain: Not on file  Food Insecurity: Not on file  Transportation Needs: Not on file  Physical Activity: Not on file  Stress: No Stress Concern Present (06/18/2022)   Received from [facility]   Stress    Feeling of Stress : Not on file  Social Connections: Unknown (02/04/2021)   [facility]   Social Isolation    How often do you see or talk to people that you care about and feel close to?: Not on file     Family History: The patient's family history includes Cancer in her mother; Heart disease in her brother, brother, and father. ROS:    Please see the history of present illness.    All 14 point review of systems negative except as described per history of present illness  EKGs/Labs/Other Studies Reviewed:         Recent Labs: 04/22/2023: ALT 16 08/01/2023: BUN 21; Creatinine, Ser 0.85; Hemoglobin 13.8; Platelets 286; Potassium 3.9; Sodium 139  Recent Lipid Panel    Component Value Date/Time   CHOL 187 04/22/2023 0907   TRIG 94 04/22/2023 0907   HDL 60 04/22/2023 0907   CHOLHDL 3.1 04/22/2023 0907   CHOLHDL 4.6 02/02/2020 1010   VLDL 27 02/02/2020 1010   LDLCALC 110 (H) 04/22/2023 0907    Physical Exam:    VS:  BP 110/82 (BP Location: Right Arm, Patient Position: Sitting)   Pulse 88   Ht 5' 1 (1.549 m)   Wt 173 lb (78.5 kg)   SpO2 96%   BMI 32.69 kg/m     Wt Readings from Last 3 Encounters:  11/01/23 173 lb (78.5 kg)  08/02/23 174 lb (78.9 kg)  08/01/23 174 lb 6.4 oz (79.1 kg)     GEN:  Well nourished, well developed in no acute distress HEENT: Normal NECK: No JVD; No carotid bruits LYMPHATICS: No lymphadenopathy CARDIAC: RRR, no murmurs, no rubs, no gallops RESPIRATORY:  Clear to auscultation without rales, wheezing or rhonchi  ABDOMEN: Soft, non-tender, non-distended MUSCULOSKELETAL:  No edema; No deformity  SKIN: Warm and dry LOWER EXTREMITIES: no swelling NEUROLOGIC:  Alert and oriented x 3 PSYCHIATRIC:  Normal affect   ASSESSMENT:    1. Coronary artery disease of native artery of native heart with stable angina pectoris (HCC)   2. Dyslipidemia    PLAN:    In order of problems listed above:  Coronary disease stable from that point review September cardiac catheterization assessed recently. Dyslipidemia I did review K PN LDL 110 HDL 60 she is on PCSK9 agent which I will continue this blood test is from December 6 we will repeat her fasting lipid profile today. Near syncope denies having any she does have implantable loop recorder I did review interrogation no arrhythmia  identified   Medication Adjustments/Labs and Tests Ordered: Current medicines are reviewed at length with the patient today.  Concerns regarding medicines are outlined above.  Orders Placed This Encounter  Procedures   Lipid panel   Medication changes: No orders of the defined types were placed in this encounter.   Signed, Manfred Seed, MD, [facility] 11/01/2023 1:03 PM    [facility]

## 2023-11-02 LAB — LIPID PANEL
Chol/HDL Ratio: 2.8 ratio (ref 0.0–4.4)
Cholesterol, Total: 193 mg/dL (ref 100–199)
HDL: 70 mg/dL (ref >39)
LDL Chol Calc (NIH): 106 mg/dL — ABNORMAL HIGH (ref 0–99)
Triglycerides: 98 mg/dL (ref 0–149)
VLDL Cholesterol Cal: 17 mg/dL (ref 5–40)

## 2023-11-02 NOTE — Progress Notes (Signed)
 Carelink Summary Report / Loop Recorder

## 2023-11-06 ENCOUNTER — Ambulatory Visit: Payer: Self-pay | Admitting: Cardiology

## 2023-11-14 ENCOUNTER — Ambulatory Visit

## 2023-11-14 DIAGNOSIS — R55 Syncope and collapse: Secondary | ICD-10-CM

## 2023-11-14 LAB — CUP PACEART REMOTE DEVICE CHECK
Date Time Interrogation Session: 20250629231248
Implantable Pulse Generator Implant Date: 20210921

## 2023-11-16 ENCOUNTER — Ambulatory Visit: Payer: Self-pay | Admitting: Cardiology

## 2023-12-01 NOTE — Progress Notes (Signed)
 Carelink Summary Report / Loop Recorder

## 2023-12-07 ENCOUNTER — Telehealth: Payer: Self-pay

## 2023-12-07 DIAGNOSIS — E785 Hyperlipidemia, unspecified: Secondary | ICD-10-CM

## 2023-12-07 MED ORDER — ROSUVASTATIN CALCIUM 10 MG PO TABS: 10.0000 mg | ORAL_TABLET | Freq: Every day | ORAL | 3 refills | Status: DC

## 2023-12-07 NOTE — Telephone Encounter (Signed)
 Left message on My Chart with lab results per Dr. Vanetta Shawl note. Routed to PCP.

## 2023-12-15 ENCOUNTER — Ambulatory Visit (INDEPENDENT_AMBULATORY_CARE_PROVIDER_SITE_OTHER)

## 2023-12-15 ENCOUNTER — Telehealth: Payer: Self-pay

## 2023-12-15 DIAGNOSIS — R55 Syncope and collapse: Secondary | ICD-10-CM | POA: Diagnosis not present

## 2023-12-15 LAB — CUP PACEART REMOTE DEVICE CHECK
Date Time Interrogation Session: 20250730230632
Implantable Pulse Generator Implant Date: 20210921

## 2023-12-15 NOTE — Telephone Encounter (Signed)
 Pt viewed lab results on My Chart per Dr. Vanetta Shawl note. Routed to PCP.

## 2023-12-19 ENCOUNTER — Ambulatory Visit: Payer: Self-pay | Admitting: Cardiology

## 2023-12-29 ENCOUNTER — Other Ambulatory Visit: Payer: Self-pay

## 2023-12-29 MED ORDER — ROSUVASTATIN CALCIUM 10 MG PO TABS: 10.0000 mg | ORAL_TABLET | Freq: Every day | ORAL | 2 refills | Status: AC

## 2023-12-29 MED ORDER — NITROGLYCERIN 0.4 MG SL SUBL: 0.4000 mg | SUBLINGUAL_TABLET | SUBLINGUAL | 9 refills | Status: AC | PRN

## 2023-12-29 MED ORDER — ISOSORBIDE MONONITRATE ER 120 MG PO TB24
120.0000 mg | ORAL_TABLET | Freq: Every day | ORAL | 2 refills | Status: AC
Start: 2023-12-29 — End: ?

## 2023-12-29 MED ORDER — RANOLAZINE ER 1000 MG PO TB12: 1000.0000 mg | ORAL_TABLET | Freq: Two times a day (BID) | ORAL | 2 refills | Status: AC

## 2023-12-29 MED ORDER — AMLODIPINE BESYLATE 10 MG PO TABS: 10.0000 mg | ORAL_TABLET | Freq: Every day | ORAL | 2 refills | Status: DC

## 2023-12-29 MED ORDER — LOSARTAN POTASSIUM 100 MG PO TABS: 100.0000 mg | ORAL_TABLET | Freq: Every day | ORAL | 2 refills | Status: AC

## 2023-12-29 MED ORDER — PRALUENT 150 MG/ML ~~LOC~~ SOAJ: 150.0000 mg | SUBCUTANEOUS | 2 refills | Status: AC

## 2023-12-29 MED ORDER — CLOPIDOGREL BISULFATE 75 MG PO TABS
75.0000 mg | ORAL_TABLET | Freq: Every day | ORAL | 2 refills | Status: AC
Start: 2023-12-29 — End: ?

## 2024-01-16 ENCOUNTER — Ambulatory Visit

## 2024-01-16 DIAGNOSIS — I25118 Atherosclerotic heart disease of native coronary artery with other forms of angina pectoris: Secondary | ICD-10-CM

## 2024-01-18 LAB — CUP PACEART REMOTE DEVICE CHECK
Date Time Interrogation Session: 20250830230725
Implantable Pulse Generator Implant Date: 20210921

## 2024-01-21 ENCOUNTER — Ambulatory Visit: Payer: Self-pay | Admitting: Cardiology

## 2024-01-24 NOTE — Progress Notes (Signed)
 Remote Loop Recorder Transmission

## 2024-02-14 NOTE — Progress Notes (Signed)
 Remote Loop Recorder Transmission

## 2024-02-15 NOTE — Progress Notes (Signed)
 Remote Loop Recorder Transmission

## 2024-02-16 ENCOUNTER — Ambulatory Visit

## 2024-02-16 DIAGNOSIS — I25118 Atherosclerotic heart disease of native coronary artery with other forms of angina pectoris: Secondary | ICD-10-CM | POA: Diagnosis not present

## 2024-02-16 LAB — CUP PACEART REMOTE DEVICE CHECK
Date Time Interrogation Session: 20251001230603
Implantable Pulse Generator Implant Date: 20210921

## 2024-02-16 NOTE — Progress Notes (Signed)
 Remote Loop Recorder Transmission

## 2024-02-17 ENCOUNTER — Ambulatory Visit: Payer: Self-pay | Admitting: Cardiology

## 2024-03-07 ENCOUNTER — Other Ambulatory Visit: Payer: Self-pay

## 2024-03-07 MED ORDER — AMLODIPINE BESYLATE 10 MG PO TABS: 10.0000 mg | ORAL_TABLET | Freq: Every day | ORAL | 2 refills | Status: AC

## 2024-03-19 ENCOUNTER — Ambulatory Visit (INDEPENDENT_AMBULATORY_CARE_PROVIDER_SITE_OTHER)

## 2024-03-19 DIAGNOSIS — I25118 Atherosclerotic heart disease of native coronary artery with other forms of angina pectoris: Secondary | ICD-10-CM

## 2024-03-19 LAB — CUP PACEART REMOTE DEVICE CHECK
Date Time Interrogation Session: 20251102230515
Implantable Pulse Generator Implant Date: 20210921

## 2024-03-20 ENCOUNTER — Ambulatory Visit: Payer: Self-pay | Admitting: Cardiology

## 2024-03-21 NOTE — Progress Notes (Signed)
 Remote Loop Recorder Transmission

## 2024-04-19 ENCOUNTER — Encounter

## 2024-04-21 ENCOUNTER — Ambulatory Visit: Attending: Cardiology

## 2024-04-21 DIAGNOSIS — I25118 Atherosclerotic heart disease of native coronary artery with other forms of angina pectoris: Secondary | ICD-10-CM | POA: Diagnosis not present

## 2024-04-23 LAB — CUP PACEART REMOTE DEVICE CHECK
Date Time Interrogation Session: 20251205230317
Implantable Pulse Generator Implant Date: 20210921

## 2024-04-25 ENCOUNTER — Ambulatory Visit: Payer: Self-pay | Admitting: Cardiology

## 2024-04-26 NOTE — Progress Notes (Signed)
 Remote Loop Recorder Transmission

## 2024-05-21 ENCOUNTER — Encounter

## 2024-05-22 ENCOUNTER — Ambulatory Visit

## 2024-05-22 DIAGNOSIS — I25118 Atherosclerotic heart disease of native coronary artery with other forms of angina pectoris: Secondary | ICD-10-CM | POA: Diagnosis not present

## 2024-05-22 LAB — CUP PACEART REMOTE DEVICE CHECK
Date Time Interrogation Session: 20260105230414
Implantable Pulse Generator Implant Date: 20210921

## 2024-05-23 ENCOUNTER — Ambulatory Visit: Payer: Self-pay | Admitting: Cardiology

## 2024-05-23 NOTE — Progress Notes (Signed)
 Remote Loop Recorder Transmission

## 2024-06-18 ENCOUNTER — Ambulatory Visit: Admitting: Diagnostic Neuroimaging

## 2024-06-18 ENCOUNTER — Telehealth: Payer: Self-pay | Admitting: Diagnostic Neuroimaging

## 2024-06-21 ENCOUNTER — Encounter

## 2024-06-22 ENCOUNTER — Ambulatory Visit

## 2024-06-22 LAB — CUP PACEART REMOTE DEVICE CHECK
Date Time Interrogation Session: 20260205230854
Implantable Pulse Generator Implant Date: 20210921

## 2024-07-23 ENCOUNTER — Encounter

## 2024-07-23 ENCOUNTER — Ambulatory Visit

## 2024-08-06 ENCOUNTER — Ambulatory Visit: Admitting: Diagnostic Neuroimaging

## 2024-08-23 ENCOUNTER — Encounter

## 2024-09-24 ENCOUNTER — Encounter
